# Patient Record
Sex: Female | Born: 1975
Health system: Southern US, Community
[De-identification: ages and names within clinical notes are randomized; demographics above are authoritative.]

## PROBLEM LIST (undated history)

## (undated) DIAGNOSIS — E162 Hypoglycemia, unspecified: Secondary | ICD-10-CM

---

## 2017-09-15 ENCOUNTER — Encounter: Payer: Self-pay | Admitting: Gynecology

## 2017-09-15 ENCOUNTER — Other Ambulatory Visit: Payer: Self-pay

## 2017-09-15 ENCOUNTER — Ambulatory Visit
Admission: EM | Admit: 2017-09-15 | Discharge: 2017-09-15 | Disposition: A | Discharge status: Home / Self Care | Payer: Self-pay | Attending: Emergency Medicine | Admitting: Emergency Medicine

## 2017-09-15 DIAGNOSIS — R059 Cough, unspecified: Secondary | ICD-10-CM

## 2017-09-15 DIAGNOSIS — R062 Wheezing: Secondary | ICD-10-CM

## 2017-09-15 DIAGNOSIS — R05 Cough: Secondary | ICD-10-CM

## 2017-09-15 MED ORDER — BENZONATATE 200 MG PO CAPS: ORAL_CAPSULE | ORAL | 0 refills | Status: DC

## 2017-09-15 MED ORDER — HYDROCOD POLST-CPM POLST ER 10-8 MG/5ML PO SUER: 5.0000 mL | Freq: Two times a day (BID) | ORAL | 0 refills | Status: DC

## 2017-09-15 NOTE — ED Triage Notes (Signed)
Patient c/o cough x over 1 week. Per patient cough worse last night.

## 2017-09-15 NOTE — ED Provider Notes (Signed)
[facility]    CSN: [id number] Arrival date & time: 09/15/17  1020     History   Chief Complaint Chief Complaint  Patient presents with  . Cough    HPI Ashley Silva is a 42 y.o. female.   HPI  42 year old female presents with cough that she has had for over a week.  It has been nonproductive.  She said no fever or chills.  No other associated symptoms such as nasal congestion sinus pain,ear pain etc.  Afebrile today O2 sats are 100% on room air blood pressure 146/94 pulse rate of 87 respirations of 16.  Works at [employer] in [employer] so does not have a lot of contact with the public.  Not know of any sick contacts.  He quit smoking many years ago.  No History of asthma or lung disorders in the past.  She has noticed wheezing at nighttime.  She states that she had to sleep in a recliner last night because of the severity of her coughing and this did help        History reviewed. No pertinent past medical history.  There are no active problems to display for this patient.   History reviewed. No pertinent surgical history.  OB History   None      Home Medications    Prior to Admission medications   Medication Sig Start Date End Date Taking? Authorizing Provider  benzonatate (TESSALON) 200 MG capsule Take one cap TID PRN cough 09/15/17   Lorin Picket, PA-C  chlorpheniramine-HYDROcodone [facility]) 10-8 MG/5ML SUER Take 5 mLs by mouth 2 (two) times daily. 09/15/17   Lorin Picket, PA-C    Family History Family History  Problem Relation Age of Onset  . Hypertension Mother     Social History Social History   Tobacco Use  . Smoking status: Former Research scientist (life sciences)  . Smokeless tobacco: Never Used  Substance Use Topics  . Alcohol use: Never    Frequency: Never  . Drug use: Never     Allergies   Patient has no known allergies.   Review of Systems Review of Systems  Constitutional: Negative for appetite change, chills,  fatigue and fever.  HENT: Negative for congestion.   Respiratory: Positive for cough and wheezing.   All other systems reviewed and are negative.    Physical Exam Triage Vital Signs ED Triage Vitals [09/15/17 1034]  Enc Vitals Group     BP (!) 146/94     Pulse Rate 87     Resp 16     Temp 98.6 F (37 C)     Temp Source Oral     SpO2 100 %     Weight 195 lb (88.5 kg)     Height 5\' 7"  (1.702 m)     Head Circumference      Peak Flow      Pain Score 2     Pain Loc      Pain Edu?      Excl. in [place]?    No data found.  Updated Vital Signs BP (!) 146/94 (BP Location: Right Arm)   Pulse 87   Temp 98.6 F (37 C) (Oral)   Resp 16   Ht 5\' 7"  (1.702 m)   Wt 195 lb (88.5 kg)   LMP 08/29/2017   SpO2 100%   BMI 30.54 kg/m   Visual Acuity Right Eye Distance:   Left Eye Distance:   Bilateral Distance:  Right Eye Near:   Left Eye Near:    Bilateral Near:     Physical Exam  Constitutional: She is oriented to person, place, and time. She appears well-developed and well-nourished. No distress.  HENT:  Head: Normocephalic.  Right Ear: External ear normal.  Left Ear: External ear normal.  Nose: Nose normal.  Mouth/Throat: Oropharynx is clear and moist. No oropharyngeal exudate.  Eyes: Pupils are equal, round, and reactive to light. Right eye exhibits no discharge. Left eye exhibits no discharge.  Neck: Normal range of motion.  Pulmonary/Chest: Effort normal and breath sounds normal.  Musculoskeletal: Normal range of motion.  Lymphadenopathy:    She has no cervical adenopathy.  Neurological: She is alert and oriented to person, place, and time.  Skin: Skin is warm and dry. She is not diaphoretic.  Psychiatric: She has a normal mood and affect. Her behavior is normal. Judgment and thought content normal.  Nursing note and vitals reviewed.    UC Treatments / Results  Labs (all labs ordered are listed, but only abnormal results are displayed) Labs Reviewed - No data to  display  EKG None  Radiology No results found.  Procedures Procedures (including critical care time)  Medications Ordered in UC Medications - No data to display  Initial Impression / Assessment and Plan / UC Course  I have reviewed the triage vital signs and the nursing notes.  Pertinent labs & imaging results that were available during my care of the patient were reviewed by me and considered in my medical decision making (see chart for details).     Plan: 1. Test/x-ray results and diagnosis reviewed with patient 2. rx as per orders; risks, benefits, potential side effects reviewed with patient 3. Recommend supportive treatment with rest as necessary and increase fluid intake.  Continue to sleep in recliner if necessary to prevent the proxisms of coughing.  If she runs high fevers has productive cough or persist for 2 weeks she may return to our clinic for reevaluation. 4. F/u prn if symptoms worsen or don't improve  Final Clinical Impressions(s) / UC Diagnoses   Final diagnoses:  Cough   Discharge Instructions   None    ED Prescriptions    Medication Sig Dispense Auth. Provider   chlorpheniramine-HYDROcodone (TUSSIONEX PENNKINETIC ER) 10-8 MG/5ML SUER Take 5 mLs by mouth 2 (two) times daily. 115 mL Crecencio Mc P, PA-C   benzonatate (TESSALON) 200 MG capsule Take one cap TID PRN cough 30 capsule Lorin Picket, PA-C     Controlled Substance Prescriptions [place] Controlled Substance Registry consulted? Not Applicable   Lorin Picket, PA-C 09/15/17 1059

## 2017-10-26 ENCOUNTER — Emergency Department
Admission: EM | Admit: 2017-10-26 | Discharge: 2017-10-26 | Disposition: A | Discharge status: Home / Self Care | Payer: Self-pay | Attending: Emergency Medicine | Admitting: Emergency Medicine

## 2017-10-26 ENCOUNTER — Encounter: Payer: Self-pay | Admitting: Emergency Medicine

## 2017-10-26 ENCOUNTER — Emergency Department: Payer: Self-pay

## 2017-10-26 ENCOUNTER — Other Ambulatory Visit: Payer: Self-pay

## 2017-10-26 DIAGNOSIS — R058 Other specified cough: Secondary | ICD-10-CM

## 2017-10-26 DIAGNOSIS — Z87891 Personal history of nicotine dependence: Secondary | ICD-10-CM | POA: Insufficient documentation

## 2017-10-26 DIAGNOSIS — Z3202 Encounter for pregnancy test, result negative: Secondary | ICD-10-CM | POA: Insufficient documentation

## 2017-10-26 DIAGNOSIS — R05 Cough: Secondary | ICD-10-CM | POA: Insufficient documentation

## 2017-10-26 DIAGNOSIS — M940 Chondrocostal junction syndrome [Tietze]: Secondary | ICD-10-CM | POA: Insufficient documentation

## 2017-10-26 HISTORY — DX: Hypoglycemia, unspecified: E16.2

## 2017-10-26 LAB — CBC
HCT: 28.6 % — ABNORMAL LOW (ref 35.0–47.0)
Hemoglobin: 9.1 g/dL — ABNORMAL LOW (ref 12.0–16.0)
MCH: 19.8 pg — AB (ref 26.0–34.0)
MCHC: 31.8 g/dL — AB (ref 32.0–36.0)
MCV: 62.1 fL — AB (ref 80.0–100.0)
Platelets: 308 10*3/uL (ref 150–440)
RBC: 4.61 MIL/uL (ref 3.80–5.20)
RDW: 20.6 % — AB (ref 11.5–14.5)
WBC: 6.3 10*3/uL (ref 3.6–11.0)

## 2017-10-26 LAB — BASIC METABOLIC PANEL
Anion gap: 10 (ref 5–15)
BUN: 13 mg/dL (ref 6–20)
CALCIUM: 9.3 mg/dL (ref 8.9–10.3)
CHLORIDE: 105 mmol/L (ref 98–111)
CO2: 24 mmol/L (ref 22–32)
CREATININE: 0.65 mg/dL (ref 0.44–1.00)
GFR calc non Af Amer: >60 mL/min (ref >60)
GLUCOSE: 95 mg/dL (ref 70–99)
Potassium: 3.8 mmol/L (ref 3.5–5.1)
Sodium: 139 mmol/L (ref 135–145)

## 2017-10-26 LAB — TROPONIN I
Troponin I: <0.03 ng/mL (ref <0.03)
Troponin I: <0.03 ng/mL (ref <0.03)

## 2017-10-26 LAB — POCT PREGNANCY, URINE: Preg Test, Ur: NEGATIVE

## 2017-10-26 MED ORDER — KETOROLAC TROMETHAMINE 10 MG PO TABS: 10.0000 mg | ORAL_TABLET | Freq: Three times a day (TID) | ORAL | 0 refills | Status: DC | PRN

## 2017-10-26 MED ORDER — KETOROLAC TROMETHAMINE 10 MG PO TABS
10.0000 mg | ORAL_TABLET | Freq: Once | ORAL | Status: AC
  Administered 2017-10-26: 10 mg via ORAL
  Filled 2017-10-26 (×2): qty 1

## 2017-10-26 MED ORDER — ALBUTEROL SULFATE HFA 108 (90 BASE) MCG/ACT IN AERS: 2.0000 | INHALATION_SPRAY | Freq: Four times a day (QID) | RESPIRATORY_TRACT | 0 refills | Status: DC | PRN

## 2017-10-26 MED ORDER — CETIRIZINE HCL 10 MG PO TABS: 10.0000 mg | ORAL_TABLET | Freq: Every day | ORAL | 0 refills | Status: DC

## 2017-10-26 NOTE — ED Provider Notes (Signed)
[facility] Emergency Department Provider Note  ____________________________________________  Time seen: Approximately 3:11 PM  I have reviewed the triage vital signs and the nursing notes.   HISTORY  Chief Complaint Chest Pain    HPI Ashley Silva is a 42 y.o. female my former smoker, nonpregnant, presenting with cough and chest pain.  The patient reports that for the past month, she has been having a daily nonproductive cough.  2 weeks ago, she was seen in urgent care and treated for her cough with Tussionex and Tessalon Perles, which did not help.  Her cough is worse at night or when she lays down.  The for the past 4 to 5 days, the patient has developed a central chest pain that she also has under the left breast and on the side of the chest on the left side that is worse when she pushes on it.  She has tried Advil and Aleve without improvement.  The patient denies any congestion or rhinorrhea, ear pain, sore throat, fever chills, nausea vomiting or diarrhea.  Past Medical History:  Diagnosis Date  . Hypoglycemia     There are no active problems to display for this patient.   Past Surgical History:  Procedure Laterality Date  . CESAREAN SECTION      Current Outpatient Rx  . Order #: [id number] Class: Print  . Order #: [id number] Class: Normal  . Order #: [id number] Class: Print    Allergies Patient has no known allergies.  Family History  Problem Relation Age of Onset  . Hypertension Mother     Social History Social History   Tobacco Use  . Smoking status: Former Research scientist (life sciences)  . Smokeless tobacco: Never Used  Substance Use Topics  . Alcohol use: Never    Frequency: Never  . Drug use: Never    Review of Systems Constitutional: No fever/chills.  No lightheadedness or syncope. Eyes: No visual changes. ENT: No sore throat. No congestion or rhinorrhea.  No ear pain. Cardiovascular: Positive chest pain. Denies palpitations. Respiratory: Denies  shortness of breath.  Positive nonproductive cough. Gastrointestinal: No abdominal pain.  No nausea, no vomiting.  No diarrhea.  No constipation. Genitourinary: Negative for dysuria. Musculoskeletal: Negative for back pain.  No lower extremity swelling or calf pain. Skin: Negative for rash. Neurological: Negative for headaches. No focal numbness, tingling or weakness.     ____________________________________________   PHYSICAL EXAM:  VITAL SIGNS: ED Triage Vitals  Enc Vitals Group     BP 10/26/17 1154 (!) 172/85     Pulse Rate 10/26/17 1154 95     Resp 10/26/17 1154 18     Temp 10/26/17 1154 98.8 F (37.1 C)     Temp Source 10/26/17 1154 Oral     SpO2 10/26/17 1154 100 %     Weight 10/26/17 1154 200 lb (90.7 kg)     Height 10/26/17 1154 5\' 7"  (1.702 m)     Head Circumference --      Peak Flow --      Pain Score 10/26/17 1153 8     Pain Loc --      Pain Edu? --      Excl. in [place]? --     Constitutional: Alert and oriented.  Answers questions appropriately. Eyes: Conjunctivae are normal.  EOMI. No scleral icterus. Head: Atraumatic. Nose: No congestion/rhinnorhea. Mouth/Throat: Mucous membranes are moist.  No posterior pharyngeal erythema, tonsillar swelling or exudate.  The posterior pharynx is symmetric and the uvula is midline. Neck: No stridor.  Supple.  No meningismus. Cardiovascular: Normal rate, regular rhythm. No murmurs, rubs or gallops.  Chest: There are no skin changes over the chest.  The patient does have reproducible pain when I palpate over the sternum and under the left breast and lateral chest wall, or with positional changes. Respiratory: Normal respiratory effort.  No accessory muscle use or retractions. Lungs CTAB.  No wheezes, rales or ronchi. Gastrointestinal: Overweight.  Soft, nontender and nondistended.  No guarding or rebound.  No peritoneal signs. Musculoskeletal: No LE edema. No ttp in the calves or palpable cords.  Negative Homan's  sign. Neurologic:  A&Ox3.  Speech is clear.  Face and smile are symmetric.  EOMI.  Moves all extremities well. Skin:  Skin is warm, dry and intact. No rash noted. Psychiatric: Mood and affect are normal. Speech and behavior are normal.  Normal judgement  ____________________________________________   LABS (all labs ordered are listed, but only abnormal results are displayed)  Labs Reviewed  CBC - Abnormal; Notable for the following components:      Result Value   Hemoglobin 9.1 (*)    HCT 28.6 (*)    MCV 62.1 (*)    MCH 19.8 (*)    MCHC 31.8 (*)    RDW 20.6 (*)    All other components within normal limits  BASIC METABOLIC PANEL  TROPONIN I  TROPONIN I  POC URINE PREG, ED  POCT PREGNANCY, URINE   ____________________________________________  EKG  ED ECG REPORT I, Anne-Caroline Mariea Clonts, the attending physician, personally viewed and interpreted this ECG.   Date: 10/26/2017  EKG Time: 1139  Rate: 102  Rhythm: sinus tachycardia  Axis: normal  Intervals:none  ST&T Change: No STEMI  ____________________________________________  RADIOLOGY  Dg Chest 2 View  Result Date: 10/26/2017 CLINICAL DATA:  Left-sided chest pain. EXAM: CHEST - 2 VIEW COMPARISON:  No prior. FINDINGS: Mediastinum and hilar structures normal. Mild left base subsegmental atelectasis and or scarring. No focal alveolar infiltrate. No pleural effusion or pneumothorax. Heart size normal. No acute bony abnormality. IMPRESSION: Mild left base subsegmental atelectasis and or scarring. Exam otherwise unremarkable. Electronically Signed   By: Marcello Moores  Register   On: 10/26/2017 12:43    ____________________________________________   PROCEDURES  Procedure(s) performed: None  Procedures  Critical Care performed: No ____________________________________________   INITIAL IMPRESSION / ASSESSMENT AND PLAN / ED COURSE  Pertinent labs & imaging results that were available during my care of the patient were  reviewed by me and considered in my medical decision making (see chart for details).  42 y.o. female, otherwise healthy, presenting with 1 month of cough, now with left-sided chest pain.  The most likely etiology of the patient's chest pain is costochondritis or musculoskeletal strain from her coughing.  I will plan to treat her with Toradol for her symptoms, and treat her cough with albuterol to decrease strain and help her heal.  Patient's chest x-ray does not have any infiltrate in her pulmonary examination is reassuring with normal oxygen saturation; pneumonia is not suspected at this time.  She may have a bronchitis, or I would be concerned about a postnasal drip causing a coughing reflex.  We will plan to start her on Zyrtec to see if this can decrease her cough as well.  At this time, she is afebrile has no infiltrate and no elevation of her white blood cell count with a chronic cough; antibiotics are not indicated.  ACS or MI is very unlikely; the patient does not have any ischemic  changes and her troponins are negative.  In addition, aortic pathology and PE are considered but also very unlikely.  The patient will be discharged home and I have recommended that she establish a primary care physician for follow-up.  Return precautions were discussed.  ____________________________________________  FINAL CLINICAL IMPRESSION(S) / ED DIAGNOSES  Final diagnoses:  Costochondritis, acute  Nonproductive cough         NEW MEDICATIONS STARTED DURING THIS VISIT:  New Prescriptions   ALBUTEROL (PROVENTIL HFA;VENTOLIN HFA) 108 (90 BASE) MCG/ACT INHALER    Inhale 2 puffs into the lungs every 6 (six) hours as needed for wheezing or shortness of breath.   CETIRIZINE (ZYRTEC ALLERGY) 10 MG TABLET    Take 1 tablet (10 mg total) by mouth daily.   KETOROLAC (TORADOL) 10 MG TABLET    Take 1 tablet (10 mg total) by mouth every 8 (eight) hours as needed for moderate pain (with food).      Eula Listen, MD 10/26/17 [phone]

## 2017-10-26 NOTE — ED Triage Notes (Signed)
Pt presents to ED via POV with c/o CP x 1 week. Pt descibes pain as pulsating. States recently dx with URI but now pain is totally different than it was. Pt states pain radiates from L chest to L axilla.

## 2017-10-26 NOTE — Discharge Instructions (Addendum)
For your cough, take albuterol as needed for coughing spasms.  Please start to take Zyrtec every day to prevent postnasal drip, which should improve your cough.  If it does not improve your cough after 2 weeks, please discontinue this medication.  Stop taking Tussionex and Tessalon Perles as these are not helping her cough.  You may take Tylenol or Toradol for your chest pain.  Do not take other NSAID medications including Advil, Aleve, ibuprofen or Motrin when you are taking Toradol.  Please take Toradol with a small amount of food to prevent irritation of your stomach.  Return to the emergency department if you develop severe pain, lightheadedness or fainting, palpitations, shortness of breath, fever, or any other symptoms concerning to you.

## 2018-01-29 ENCOUNTER — Ambulatory Visit
Admission: EM | Admit: 2018-01-29 | Discharge: 2018-01-29 | Disposition: A | Discharge status: Home / Self Care | Payer: Managed Care, Other (non HMO) | Attending: Emergency Medicine | Admitting: Emergency Medicine

## 2018-01-29 ENCOUNTER — Other Ambulatory Visit: Payer: Self-pay

## 2018-01-29 DIAGNOSIS — J111 Influenza due to unidentified influenza virus with other respiratory manifestations: Secondary | ICD-10-CM

## 2018-01-29 DIAGNOSIS — R0981 Nasal congestion: Secondary | ICD-10-CM

## 2018-01-29 DIAGNOSIS — R05 Cough: Secondary | ICD-10-CM

## 2018-01-29 DIAGNOSIS — R69 Illness, unspecified: Secondary | ICD-10-CM | POA: Insufficient documentation

## 2018-01-29 LAB — RAPID INFLUENZA A&B ANTIGENS
Influenza A (ARMC): NEGATIVE
Influenza B (ARMC): NEGATIVE

## 2018-01-29 MED ORDER — OSELTAMIVIR PHOSPHATE 75 MG PO CAPS: 75.0000 mg | ORAL_CAPSULE | Freq: Two times a day (BID) | ORAL | 0 refills | Status: DC

## 2018-01-29 NOTE — ED Provider Notes (Signed)
[facility] ____________________________________________  Time seen: Approximately 12:35 PM  I have reviewed the triage vital signs and the nursing notes.   HISTORY  Chief Complaint Fever (APPT)   HPI Ashley Silva is a 42 y.o. female presenting for evaluation of some cough, minimal nasal congestion, chills, body aches and fever that started quickly last night.  Reports her husband had symptoms just prior to her and was positive for influenza when he was seen in urgent care last night.  States did take some over-the-counter cough and congestion medication as well as Tylenol and did take prior to arrival.  Continues to drink fluids well, decreased appetite.  States overall feels tired and has body aches.  Reports otherwise doing well.  Denies chest pain or shortness of breath.  No recent sickness.   Past Medical History:  Diagnosis Date  . Hypoglycemia     There are no active problems to display for this patient.   Past Surgical History:  Procedure Laterality Date  . CESAREAN SECTION       No current facility-administered medications for this encounter.   Current Outpatient Medications:  .  albuterol (PROVENTIL HFA;VENTOLIN HFA) 108 (90 Base) MCG/ACT inhaler, Inhale 2 puffs into the lungs every 6 (six) hours as needed for wheezing or shortness of breath., Disp: 1 Inhaler, Rfl: 0 .  oseltamivir (TAMIFLU) 75 MG capsule, Take 1 capsule (75 mg total) by mouth every 12 (twelve) hours., Disp: 10 capsule, Rfl: 0  Allergies Patient has no known allergies.  Family History  Problem Relation Age of Onset  . Hypertension Mother     Social History Social History   Tobacco Use  . Smoking status: Former Research scientist (life sciences)  . Smokeless tobacco: Never Used  Substance Use Topics  . Alcohol use: Never    Frequency: Never  . Drug use: Never    Review of Systems Constitutional: Positive fever ENT: as above.  Cardiovascular: Denies chest pain. Respiratory: Denies shortness of  breath. Gastrointestinal: No abdominal pain.  Musculoskeletal: Negative for back pain. Skin: Negative for rash.   ____________________________________________   PHYSICAL EXAM:  VITAL SIGNS: ED Triage Vitals  Enc Vitals Group     BP 01/29/18 1119 (!) 159/93     Pulse Rate 01/29/18 1119 (!) 102     Resp 01/29/18 1119 18     Temp 01/29/18 1119 99.9 F (37.7 C)     Temp Source 01/29/18 1119 Oral     SpO2 01/29/18 1119 100 %     Weight 01/29/18 1116 200 lb (90.7 kg)     Height 01/29/18 1116 5\' 7"  (1.702 m)     Head Circumference --      Peak Flow --      Pain Score 01/29/18 1116 5     Pain Loc --      Pain Edu? --      Excl. in [place]? --    Constitutional: Alert and oriented. Well appearing and in no acute distress. Eyes: Conjunctivae are normal.  Head: Atraumatic. No sinus tenderness to palpation. No swelling. No erythema.  Ears: no erythema, normal TMs bilaterally.   Nose:Nasal congestion  Mouth/Throat: Mucous membranes are moist. No pharyngeal erythema. No tonsillar swelling or exudate.  Neck: No stridor.  No cervical spine tenderness to palpation. Hematological/Lymphatic/Immunilogical: No cervical lymphadenopathy. Cardiovascular: Normal rate, regular rhythm. Grossly normal heart sounds.  Good peripheral circulation. Respiratory: Normal respiratory effort.  No retractions. No wheezes, rales or rhonchi. Good air movement.  Musculoskeletal: Ambulatory with steady gait.  Neurologic:  Normal speech and language. No gait instability. Skin:  Skin appears warm, dry and intact. No rash noted. Psychiatric: Mood and affect are normal. Speech and behavior are normal. ___________________________________________   LABS (all labs ordered are listed, but only abnormal results are displayed)  Labs Reviewed  RAPID INFLUENZA A&B ANTIGENS ([place])     PROCEDURES Procedures    INITIAL IMPRESSION / ASSESSMENT AND PLAN / ED COURSE  Pertinent labs & imaging results that were  available during my care of the patient were reviewed by me and considered in my medical decision making (see chart for details).  Well-appearing patient.  No acute distress.  Suspect influenza.  Influenza test negative, suspect false negative.  Will treat with Tamiflu.  Encourage rest, fluids, supportive care and over-the-counter medication use.Discussed indication, risks and benefits of medications with patient.  Discussed follow up with Primary care physician this week as needed.  Discussed follow up and return parameters including no resolution or any worsening concerns. Patient verbalized understanding and agreed to plan.   ____________________________________________   FINAL CLINICAL IMPRESSION(S) / ED DIAGNOSES  Final diagnoses:  Influenza-like illness     ED Discharge Orders         Ordered    oseltamivir (TAMIFLU) 75 MG capsule  Every 12 hours     01/29/18 1206           Note: This dictation was prepared with Dragon dictation along with smaller phrase technology. Any transcriptional errors that result from this process are unintentional.         Marylene Land, NP 01/29/18 1458

## 2018-01-29 NOTE — Discharge Instructions (Signed)
Take medication as prescribed. Rest. Drink plenty of fluids. Over the counter as discussed.   Follow up with your primary care physician this week as needed. Return to Urgent care for new or worsening concerns.

## 2018-01-29 NOTE — ED Triage Notes (Signed)
Patient complains of body aches and fever with cough. Patient states that her husband was positive for the flu last night.

## 2019-01-07 ENCOUNTER — Ambulatory Visit
Admission: EM | Admit: 2019-01-07 | Discharge: 2019-01-07 | Disposition: A | Discharge status: Home / Self Care | Payer: BC Managed Care – PPO | Attending: Family Medicine | Admitting: Family Medicine

## 2019-01-07 ENCOUNTER — Ambulatory Visit (INDEPENDENT_AMBULATORY_CARE_PROVIDER_SITE_OTHER): Payer: BC Managed Care – PPO

## 2019-01-07 ENCOUNTER — Encounter: Payer: Self-pay | Admitting: Emergency Medicine

## 2019-01-07 ENCOUNTER — Other Ambulatory Visit: Payer: Self-pay

## 2019-01-07 DIAGNOSIS — M545 Low back pain, unspecified: Secondary | ICD-10-CM

## 2019-01-07 MED ORDER — KETOROLAC TROMETHAMINE 60 MG/2ML IM SOLN
60.0000 mg | Freq: Once | INTRAMUSCULAR | Status: AC
  Administered 2019-01-07: 60 mg via INTRAMUSCULAR

## 2019-01-07 MED ORDER — TIZANIDINE HCL 4 MG PO TABS: 4.0000 mg | ORAL_TABLET | Freq: Three times a day (TID) | ORAL | 0 refills | Status: DC | PRN

## 2019-01-07 MED ORDER — KETOROLAC TROMETHAMINE 10 MG PO TABS: 10.0000 mg | ORAL_TABLET | Freq: Four times a day (QID) | ORAL | 0 refills | Status: DC | PRN

## 2019-01-07 NOTE — Discharge Instructions (Signed)
Rest.  Heat.  Medications as directed.  Take care  Dr. Laurel Harnden  

## 2019-01-07 NOTE — ED Provider Notes (Signed)
[facility]    CSN: [id number] Arrival date & time: 01/07/19  1649  History   Chief Complaint Chief Complaint  Patient presents with  . Back Pain   HPI  43 year old female presents with low back pain.  Patient reports ongoing low back pain which started on 11/27.  Denies fall, trauma, injury.  Patient reports that is located in the midline of the low back.  She rates her pain is 8/10 in severity.  Has been progressively worsening.  She has taken ibuprofen without resolution.  Worse with activity.  No radicular symptoms.  No saddle anesthesia.  No other medications or interventions tried.  No other associated symptoms.  No other complaints.  PMH, Surgical Hx, Family Hx, Social History reviewed and updated as below.  Past Medical History:  Diagnosis Date  . Hypoglycemia    Past Surgical History:  Procedure Laterality Date  . CESAREAN SECTION     OB History   No obstetric history on file.    Home Medications    Prior to Admission medications   Medication Sig Start Date End Date Taking? Authorizing Provider  ketorolac (TORADOL) 10 MG tablet Take 1 tablet (10 mg total) by mouth every 6 (six) hours as needed for moderate pain or severe pain. 01/07/19   Coral Spikes, DO  tiZANidine (ZANAFLEX) 4 MG tablet Take 1 tablet (4 mg total) by mouth every 8 (eight) hours as needed for muscle spasms. 01/07/19   Coral Spikes, DO  albuterol (PROVENTIL HFA;VENTOLIN HFA) 108 (90 Base) MCG/ACT inhaler Inhale 2 puffs into the lungs every 6 (six) hours as needed for wheezing or shortness of breath. 10/26/17 01/07/19  Eula Listen, MD   Family History Family History  Problem Relation Age of Onset  . Hypertension Mother    Social History Social History   Tobacco Use  . Smoking status: Former Research scientist (life sciences)  . Smokeless tobacco: Never Used  Substance Use Topics  . Alcohol use: Never    Frequency: Never  . Drug use: Never    Allergies   Patient has no known allergies.    Review of Systems Review of Systems  Musculoskeletal: Positive for back pain.  Neurological: Negative for numbness.   Physical Exam Triage Vital Signs ED Triage Vitals  Enc Vitals Group     BP 01/07/19 1732 (!) 165/93     Pulse Rate 01/07/19 1732 98     Resp 01/07/19 1732 18     Temp 01/07/19 1732 98.4 F (36.9 C)     Temp Source 01/07/19 1732 Oral     SpO2 01/07/19 1732 100 %     Weight 01/07/19 1731 205 lb (93 kg)     Height 01/07/19 1731 5\' 7"  (1.702 m)     Head Circumference --      Peak Flow --      Pain Score 01/07/19 1730 8     Pain Loc --      Pain Edu? --      Excl. in [place]? --    Updated Vital Signs BP (!) 165/93 (BP Location: Right Arm)   Pulse 98   Temp 98.4 F (36.9 C) (Oral)   Resp 18   Ht 5\' 7"  (1.702 m)   Wt 93 kg   LMP 12/24/2018 Comment: preg form signed   SpO2 100%   BMI 32.11 kg/m   Visual Acuity Right Eye Distance:   Left Eye Distance:   Bilateral Distance:    Right Eye Near:  Left Eye Near:    Bilateral Near:     Physical Exam Vitals signs and nursing note reviewed.  Constitutional:      Appearance: She is not ill-appearing.     Comments: Appears in pain.  HENT:     Head: Normocephalic and atraumatic.  Eyes:     General:        Right eye: No discharge.        Left eye: No discharge.     Conjunctiva/sclera: Conjunctivae normal.  Cardiovascular:     Rate and Rhythm: Normal rate and regular rhythm.     Heart sounds: No murmur.  Pulmonary:     Effort: Pulmonary effort is normal.     Breath sounds: Normal breath sounds. No wheezing, rhonchi or rales.  Musculoskeletal:     Comments: Lumbar spine -right paraspinal musculature tenderness to palpation.  Markedly decreased range of motion secondary to pain.  Patient difficulty getting on the table.  Neurological:     Mental Status: She is alert.  Psychiatric:        Mood and Affect: Mood normal.        Behavior: Behavior normal.    UC Treatments / Results  Labs (all labs ordered  are listed, but only abnormal results are displayed) Labs Reviewed - No data to display  EKG   Radiology Dg Lumbar Spine Complete  Result Date: 01/07/2019 CLINICAL DATA:  Acute low back pain without injury. EXAM: LUMBAR SPINE - COMPLETE 4+ VIEW COMPARISON:  None. FINDINGS: There is no evidence of lumbar spine fracture. Alignment is normal. Intervertebral disc spaces are maintained. IMPRESSION: Negative. Electronically Signed   By: Marijo Conception M.D.   On: 01/07/2019 18:28    Procedures Procedures (including critical care time)  Medications Ordered in UC Medications  ketorolac (TORADOL) injection 60 mg (60 mg Intramuscular Given 01/07/19 1747)    Initial Impression / Assessment and Plan / UC Course  I have reviewed the triage vital signs and the nursing notes.  Pertinent labs & imaging results that were available during my care of the patient were reviewed by me and considered in my medical decision making (see chart for details).    43 year old female presents with low back pain.  X-ray obtained given current severity of symptoms.  X-ray negative.  Toradol given today.  Mild improvement.  Discharging home on Toradol and Zanaflex.  Work note given.  Final Clinical Impressions(s) / UC Diagnoses   Final diagnoses:  Acute bilateral low back pain without sciatica     Discharge Instructions     Rest.  Heat.  Medications as directed.  Take care  Dr. Lacinda Axon     ED Prescriptions    Medication Sig Dispense Auth. Provider   ketorolac (TORADOL) 10 MG tablet Take 1 tablet (10 mg total) by mouth every 6 (six) hours as needed for moderate pain or severe pain. 20 tablet Keisha Amer G, DO   tiZANidine (ZANAFLEX) 4 MG tablet Take 1 tablet (4 mg total) by mouth every 8 (eight) hours as needed for muscle spasms. 30 tablet Coral Spikes, DO     PDMP not reviewed this encounter.   Coral Spikes, [place] 01/07/19 1831

## 2019-01-07 NOTE — ED Triage Notes (Signed)
Patient c/o low back pain that started the day after Thanksgiving. Denies injury.

## 2019-06-16 DIAGNOSIS — L814 Other melanin hyperpigmentation: Secondary | ICD-10-CM | POA: Diagnosis not present

## 2019-06-16 DIAGNOSIS — D235 Other benign neoplasm of skin of trunk: Secondary | ICD-10-CM | POA: Diagnosis not present

## 2019-06-16 DIAGNOSIS — D229 Melanocytic nevi, unspecified: Secondary | ICD-10-CM | POA: Diagnosis not present

## 2019-06-16 DIAGNOSIS — L821 Other seborrheic keratosis: Secondary | ICD-10-CM | POA: Diagnosis not present

## 2019-11-22 IMAGING — CR DG CHEST 2V
1 series · 2 of 2 positions shown · non-contrast
Comparison: No prior.

CLINICAL DATA: Left-sided chest pain.

EXAM:
CHEST - 2 VIEW

[Series 1: dg chest 2 view · 0.14mm/px · 2 of 2 slices shown]
[im 1/2]
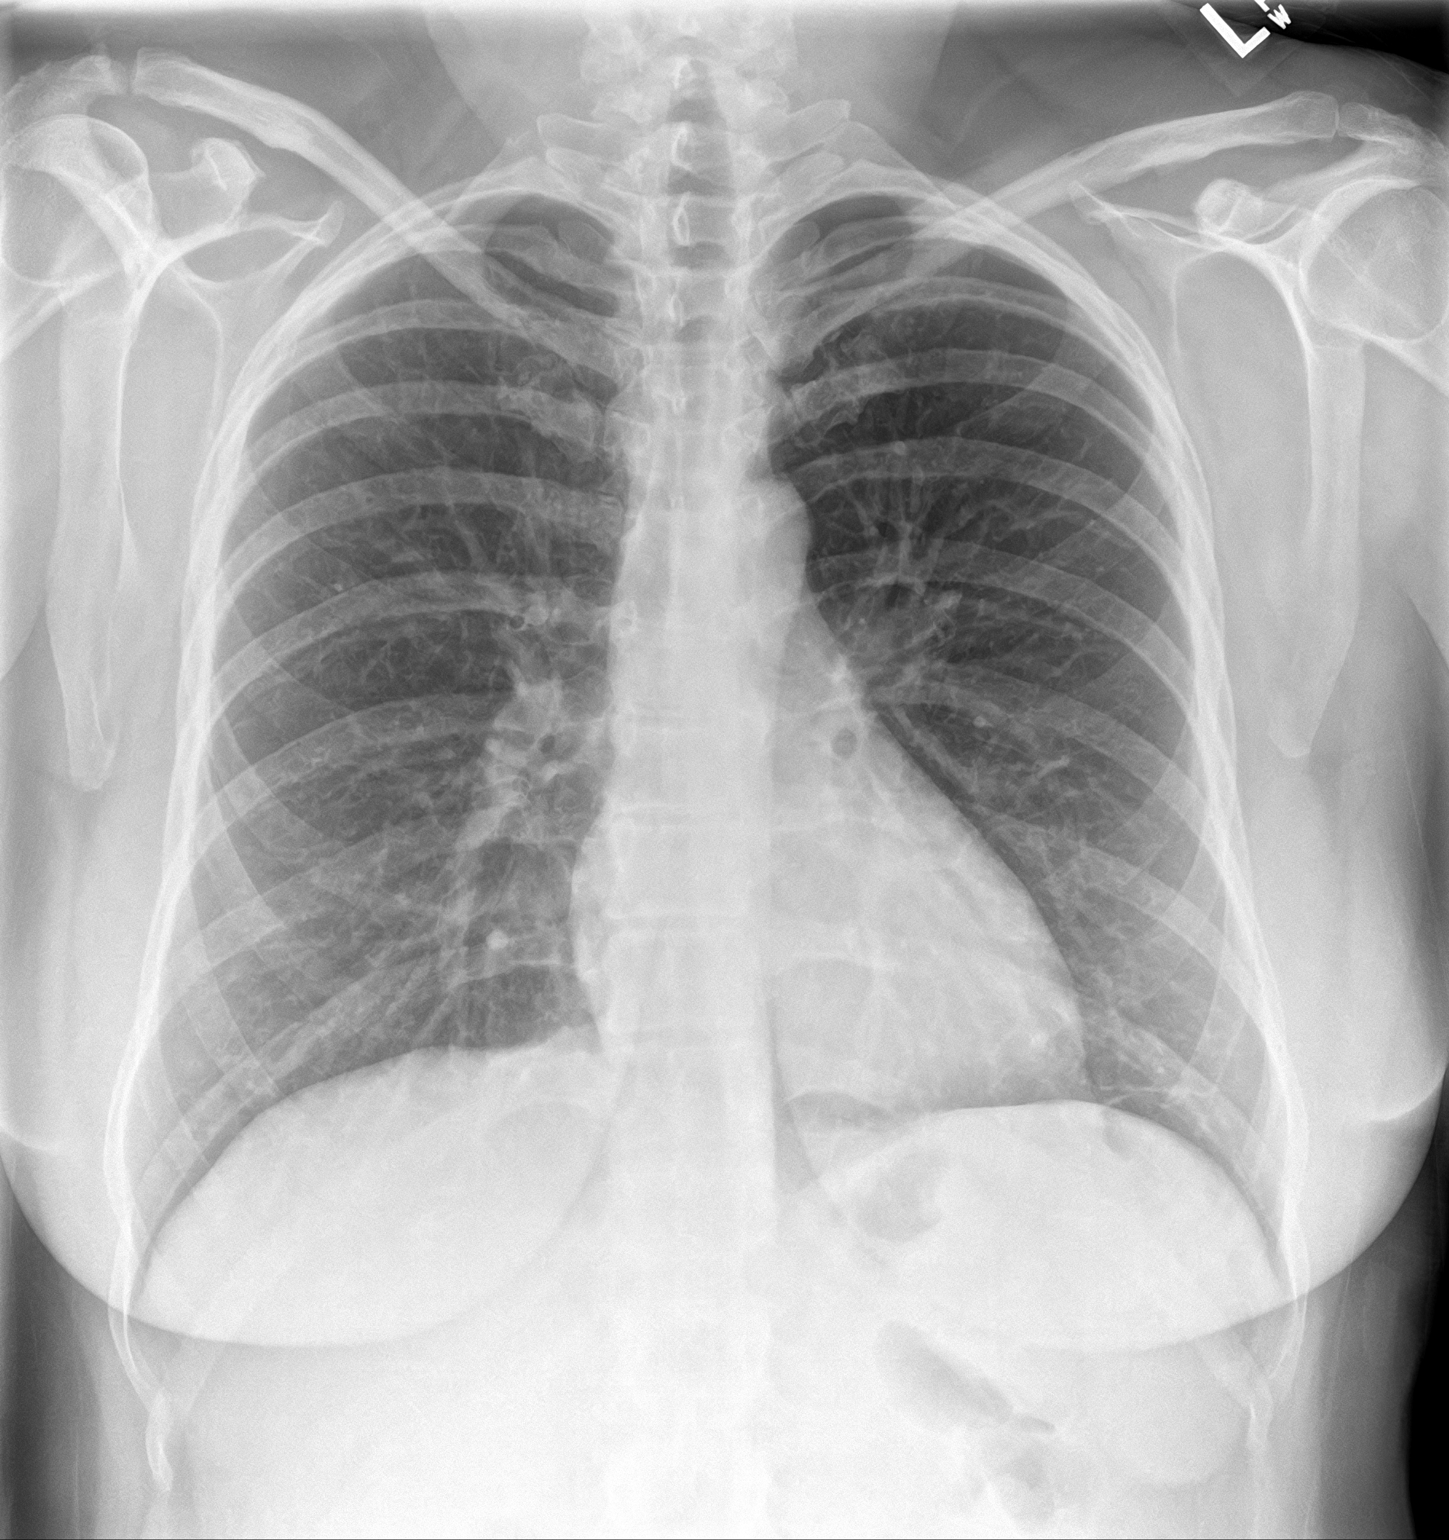
[im 2/2]
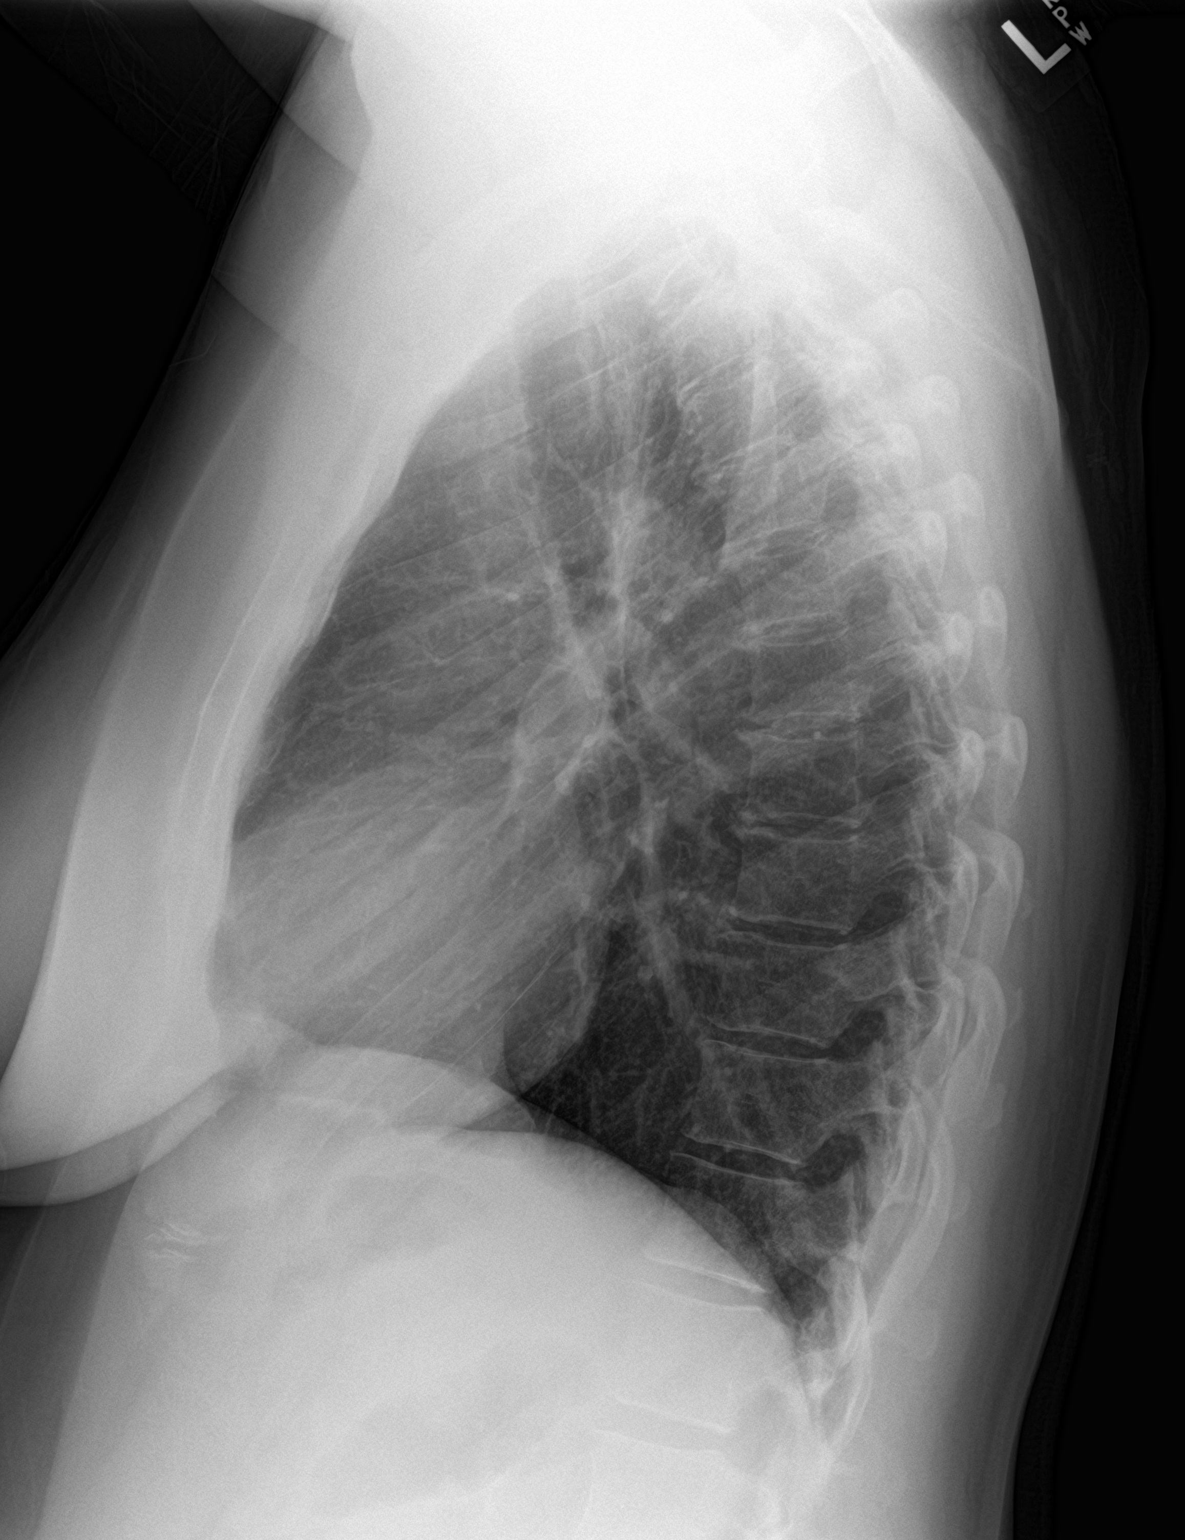

[2 of 2 positions shown; findings below may reference images not displayed]

FINDINGS: Mediastinum and hilar structures normal. Mild left base subsegmental
atelectasis and or scarring. No focal alveolar infiltrate. No
pleural effusion or pneumothorax. Heart size normal. No acute bony
abnormality.
IMPRESSION: Mild left base subsegmental atelectasis and or scarring. Exam
otherwise unremarkable.

## 2020-01-23 ENCOUNTER — Encounter: Payer: Self-pay | Admitting: Emergency Medicine

## 2020-01-23 ENCOUNTER — Other Ambulatory Visit: Payer: Self-pay

## 2020-01-23 ENCOUNTER — Ambulatory Visit
Admission: EM | Admit: 2020-01-23 | Discharge: 2020-01-23 | Disposition: A | Discharge status: Home / Self Care | Payer: BC Managed Care – PPO | Attending: Physician Assistant | Admitting: Physician Assistant

## 2020-01-23 DIAGNOSIS — U071 COVID-19: Secondary | ICD-10-CM | POA: Diagnosis not present

## 2020-01-23 DIAGNOSIS — R509 Fever, unspecified: Secondary | ICD-10-CM

## 2020-01-23 DIAGNOSIS — R059 Cough, unspecified: Secondary | ICD-10-CM | POA: Diagnosis not present

## 2020-01-23 LAB — RESP PANEL BY RT-PCR (FLU A&B, COVID) ARPGX2
Influenza A by PCR: NEGATIVE
Influenza B by PCR: NEGATIVE
SARS Coronavirus 2 by RT PCR: POSITIVE — AB

## 2020-01-23 NOTE — Discharge Instructions (Signed)

## 2020-01-23 NOTE — ED Provider Notes (Signed)
[facility]    CSN: [id number] Arrival date & time: 01/23/20  1210      History   Chief Complaint Chief Complaint  Patient presents with   Cough   Fever   Covid Positive    HPI Ashley Silva is a 44 y.o. female presenting for onset of cough, runny nose, headache and fever up to 100.9 degrees over the past day.  Patient denies any known COVID-19 exposure and has not been vaccinated for COVID-19.  She did test positive with an at home Covid test this morning.  Patient has been taking Tylenol Cold and flu with the last dose about 3 hours ago.  She says this medication does improve her symptoms.  Patient denies any associated weakness, body aches, sinus pain, ear pain, chest discomfort, shortness of breath or wheezing, abdominal pain, nausea/vomiting/diarrhea, or changes in smell or taste.  Past medical history only significant for hypoglycemia.  Denies any history of cardiopulmonary disease.  Patient has no other complaints or concerns.  HPI  Past Medical History:  Diagnosis Date   Hypoglycemia     There are no problems to display for this patient.   Past Surgical History:  Procedure Laterality Date   CESAREAN SECTION      OB History   No obstetric history on file.      Home Medications    Prior to Admission medications   Medication Sig Start Date End Date Taking? Authorizing Provider  albuterol (PROVENTIL HFA;VENTOLIN HFA) 108 (90 Base) MCG/ACT inhaler Inhale 2 puffs into the lungs every 6 (six) hours as needed for wheezing or shortness of breath. 10/26/17 01/07/19  Eula Listen, MD    Family History Family History  Problem Relation Age of Onset   Hypertension Mother    Other Father        unknown medical history    Social History Social History   Tobacco Use   Smoking status: Former Smoker    Quit date: 01/23/1996    Years since quitting: 24.0   Smokeless tobacco: Never Used  Vaping Use   Vaping Use: Never used  Substance  Use Topics   Alcohol use: Never   Drug use: Never     Allergies   Patient has no known allergies.   Review of Systems Review of Systems  Constitutional: Positive for fatigue and fever. Negative for chills and diaphoresis.  HENT: Positive for congestion and rhinorrhea. Negative for ear pain, sinus pressure, sinus pain and sore throat.   Respiratory: Positive for cough. Negative for shortness of breath.   Gastrointestinal: Negative for abdominal pain, nausea and vomiting.  Musculoskeletal: Negative for arthralgias and myalgias.  Skin: Negative for rash.  Neurological: Positive for headaches. Negative for weakness.  Hematological: Negative for adenopathy.     Physical Exam Triage Vital Signs ED Triage Vitals  Enc Vitals Group     BP 01/23/20 1334 (!) 152/87     Pulse Rate 01/23/20 1334 (!) 106     Resp 01/23/20 1334 18     Temp 01/23/20 1334 98.3 F (36.8 C)     Temp Source 01/23/20 1334 Oral     SpO2 01/23/20 1334 100 %     Weight 01/23/20 1336 185 lb (83.9 kg)     Height 01/23/20 1336 5\' 7"  (1.702 m)     Head Circumference --      Peak Flow --      Pain Score 01/23/20 1335 7     Pain Loc --  Pain Edu? --      Excl. in [place]? --    No data found.  Updated Vital Signs BP (!) 152/87 (BP Location: Left Arm)    Pulse (!) 106    Temp 98.3 F (36.8 C) (Oral)    Resp 18    Ht 5\' 7"  (1.702 m)    Wt 185 lb (83.9 kg)    LMP 01/16/2020 (Approximate)    SpO2 100%    BMI 28.98 kg/m       Physical Exam Vitals and nursing note reviewed.  Constitutional:      General: She is not in acute distress.    Appearance: Normal appearance. She is not ill-appearing or toxic-appearing.  HENT:     Head: Normocephalic and atraumatic.     Nose: Congestion and rhinorrhea present.     Mouth/Throat:     Mouth: Mucous membranes are moist.     Pharynx: Oropharynx is clear. Posterior oropharyngeal erythema present.  Eyes:     General: No scleral icterus.       Right eye: No discharge.         Left eye: No discharge.     Conjunctiva/sclera: Conjunctivae normal.  Cardiovascular:     Rate and Rhythm: Regular rhythm. Tachycardia present.     Heart sounds: Normal heart sounds.  Pulmonary:     Effort: Pulmonary effort is normal. No respiratory distress.     Breath sounds: Normal breath sounds.  Musculoskeletal:     Cervical back: Neck supple.  Skin:    General: Skin is dry.  Neurological:     General: No focal deficit present.     Mental Status: She is alert. Mental status is at baseline.     Motor: No weakness.     Gait: Gait normal.  Psychiatric:        Mood and Affect: Mood normal.        Behavior: Behavior normal.        Thought Content: Thought content normal.      UC Treatments / Results  Labs (all labs ordered are listed, but only abnormal results are displayed) Labs Reviewed  RESP PANEL BY RT-PCR (FLU A&B, COVID) ARPGX2 - Abnormal; Notable for the following components:      Result Value   SARS Coronavirus 2 by RT PCR POSITIVE (*)    All other components within normal limits    EKG   Radiology No results found.  Procedures Procedures (including critical care time)  Medications Ordered in UC Medications - No data to display  Initial Impression / Assessment and Plan / UC Course  I have reviewed the triage vital signs and the nursing notes.  Pertinent labs & imaging results that were available during my care of the patient were reviewed by me and considered in my medical decision making (see chart for details).   44 y/o female presenting for cough, fever, congestion since yesterday.  Patient had positive at home Covid test.  BP elevated 152/87.  Patient afebrile.  Pulse elevated at 106.  Oxygen saturation 100%.  Patient is well-appearing and in no acute distress.  Chest is clear to auscultation heart regular rate and rhythm.  Chest minor nasal congestion/rhinorrhea on exam.  Respiratory panel obtained with positive COVID-19 test result.  Advised  patient of result.  See guidelines, isolation protocol and ED precautions reviewed with patient.  Advised I could send a cough medication for her, but she said that the over-the-counter medication she is taking are  working just fine and she will continue them.  Work note given for her 10-day isolation period.  Again, ED precautions thoroughly reviewed with patient.   Final Clinical Impressions(s) / UC Diagnoses   Final diagnoses:  Cough  Fever, unspecified  COVID-19     Discharge Instructions     You have received COVID testing today either for positive exposure, concerning symptoms that could be related to COVID infection, screening purposes, or re-testing after confirmed positive.  Your test obtained today checks for active viral infection in the last 1-2 weeks. If your test is negative now, you can still test positive later. So, if you do develop symptoms you should either get re-tested and/or isolate x 10 days. Please follow CDC guidelines.  While Rapid antigen tests come back in 15-20 minutes, send out PCR/molecular test results typically come back within 24 hours. In the mean time, if you are symptomatic, assume this could be a positive test and treat/monitor yourself as if you do have COVID.   We will call with test results. Please download the MyChart app and set up a profile to access test results.   If symptomatic, go home and rest. Push fluids. Take Tylenol as needed for discomfort. Gargle warm salt water. Throat lozenges. Take Mucinex DM or Robitussin for cough. Humidifier in bedroom to ease coughing. Warm showers. Also review the COVID handout for more information.  COVID-19 INFECTION: The incubation period of COVID-19 is approximately 14 days after exposure, with most symptoms developing in roughly 4-5 days. Symptoms may range in severity from mild to critically severe. Roughly 80% of those infected will have mild symptoms. People of any age may become infected with COVID-19  and have the ability to transmit the virus. The most common symptoms include: fever, fatigue, cough, body aches, headaches, sore throat, nasal congestion, shortness of breath, nausea, vomiting, diarrhea, changes in smell and/or taste.    COURSE OF ILLNESS Some patients may begin with mild disease which can progress quickly into critical symptoms. If your symptoms are worsening please call ahead to the Emergency Department and proceed there for further treatment. Recovery time appears to be roughly 1-2 weeks for mild symptoms and 3-6 weeks for severe disease.   GO IMMEDIATELY TO ER FOR FEVER YOU ARE UNABLE TO GET DOWN WITH TYLENOL, BREATHING PROBLEMS, CHEST PAIN, FATIGUE, LETHARGY, INABILITY TO EAT OR DRINK, ETC  QUARANTINE AND ISOLATION: To help decrease the spread of COVID-19 please remain isolated if you have COVID infection or are highly suspected to have COVID infection. This means -stay home and isolate to one room in the home if you live with others. Do not share a bed or bathroom with others while ill, sanitize and wipe down all countertops and keep common areas clean and disinfected. You may discontinue isolation if you have a mild case and are asymptomatic 10 days after symptom onset as long as you have been fever free >24 hours without having to take Motrin or Tylenol. If your case is more severe (meaning you develop pneumonia or are admitted in the hospital), you may have to isolate longer.   If you have been in close contact (within 6 feet) of someone diagnosed with COVID 19, you are advised to quarantine in your home for 14 days as symptoms can develop anywhere from 2-14 days after exposure to the virus. If you develop symptoms, you  must isolate.  Most current guidelines for COVID after exposure -isolate 10 days if you ARE NOT tested for COVID  as long as symptoms do not develop -isolate 7 days if you are tested and remain asymptomatic -You do not necessarily need to be tested for COVID if  you have + exposure and        develop   symptoms. Just isolate at home x10 days from symptom onset During this global pandemic, CDC advises to practice social distancing, try to stay at least 80ft away from others at all times. Wear a face covering. Wash and sanitize your hands regularly and avoid going anywhere that is not necessary.  KEEP IN MIND THAT THE COVID TEST IS NOT 100% ACCURATE AND YOU SHOULD STILL DO EVERYTHING TO PREVENT POTENTIAL SPREAD OF VIRUS TO OTHERS (WEAR MASK, WEAR GLOVES, WASH HANDS AND SANITIZE REGULARLY). IF INITIAL TEST IS NEGATIVE, THIS MAY NOT MEAN YOU ARE DEFINITELY NEGATIVE. MOST ACCURATE TESTING IS DONE 5-7 DAYS AFTER EXPOSURE.   It is not advised by CDC to get re-tested after receiving a positive COVID test since you can still test positive for weeks to months after you have already cleared the virus.   *If you have not been vaccinated for COVID, I strongly suggest you consider getting vaccinated as long as there are no contraindications.      ED Prescriptions    None     PDMP not reviewed this encounter.   Shirlee Latch, PA-C 01/23/20 1444

## 2020-01-23 NOTE — ED Triage Notes (Signed)
Patient in today c/o cough, runny nose and fever (100.9) x 1 day. Patient does states that she has had a headache x 2-3 days. Patient has not had the covid vaccines. Patient took an at home covid test this morning and it was positive. Patient has taken Tylenol cold and flu, last dose at ~10:30am today.

## 2020-01-25 ENCOUNTER — Telehealth: Payer: Self-pay | Admitting: Oncology

## 2020-01-25 NOTE — Telephone Encounter (Signed)
Re: [facility]fusion  Called to Discuss with patient about Covid symptoms and the use of regeneron, a monoclonal antibody infusion for those with mild to moderate Covid symptoms and at a high risk of hospitalization.     Patient does not meet criteria for infusion.   Past Medical History:  Diagnosis Date  . Hypoglycemia     Specific risk condition-2 Unvaccinated Home test 12/24 BMI-28.98   Unable to reach pt. Left VM.  Rulon Abide, AGNP-C [phone] ([place])

## 2020-03-19 ENCOUNTER — Other Ambulatory Visit: Payer: Self-pay

## 2020-03-19 ENCOUNTER — Ambulatory Visit: Payer: BC Managed Care – PPO | Admitting: Family Medicine

## 2020-03-19 ENCOUNTER — Encounter: Payer: Self-pay | Admitting: Family Medicine

## 2020-03-19 VITALS — BP 130/78 | HR 80 | Ht 67.0 in | Wt 191.0 lb

## 2020-03-19 DIAGNOSIS — Z7689 Persons encountering health services in other specified circumstances: Secondary | ICD-10-CM

## 2020-03-19 NOTE — Patient Instructions (Signed)

## 2020-03-19 NOTE — Progress Notes (Signed)
Date:  03/19/2020   Name:  Ashley Silva   DOB:  1975-10-31   MRN:  [id number]   Chief Complaint: Establish Care (Get established- will set up physical)  Patient is a 45 year old female who presents for a establish care exam. The patient reports the following problems: . Health maintenance has been reviewed will be pending.anxiety/depression.   Lab Results  Component Value Date   CREATININE 0.65 10/26/2017   BUN 13 10/26/2017   NA 139 10/26/2017   K 3.8 10/26/2017   CL 105 10/26/2017   CO2 24 10/26/2017   No results found for: CHOL, HDL, LDLCALC, LDLDIRECT, TRIG, CHOLHDL No results found for: TSH No results found for: HGBA1C Lab Results  Component Value Date   WBC 6.3 10/26/2017   HGB 9.1 (L) 10/26/2017   HCT 28.6 (L) 10/26/2017   MCV 62.1 (L) 10/26/2017   PLT 308 10/26/2017   No results found for: ALT, AST, GGT, ALKPHOS, BILITOT   Review of Systems  Constitutional: Negative.  Negative for chills, fatigue, fever and unexpected weight change.  HENT: Negative for congestion, ear discharge, ear pain, rhinorrhea, sinus pressure, sneezing and sore throat.   Eyes: Negative for double vision, photophobia, pain, discharge, redness and itching.  Respiratory: Negative for cough, shortness of breath, wheezing and stridor.   Gastrointestinal: Negative for abdominal pain, blood in stool, constipation, diarrhea, nausea and vomiting.  Endocrine: Negative for cold intolerance, heat intolerance, polydipsia, polyphagia and polyuria.  Genitourinary: Negative for dysuria, flank pain, frequency, hematuria, menstrual problem, pelvic pain, urgency, vaginal bleeding and vaginal discharge.  Musculoskeletal: Negative for arthralgias, back pain and myalgias.  Skin: Negative for rash.  Allergic/Immunologic: Negative for environmental allergies and food allergies.  Neurological: Negative for dizziness, weakness, light-headedness, numbness and headaches.  Hematological: Negative for adenopathy. Does  not bruise/bleed easily.  Psychiatric/Behavioral: Negative for dysphoric mood. The patient is not nervous/anxious.     There are no problems to display for this patient.   No Known Allergies  Past Surgical History:  Procedure Laterality Date  . CESAREAN SECTION     1    Social History   Tobacco Use  . Smoking status: Former Smoker    Years: 5.00    Quit date: 01/23/1996    Years since quitting: 24.1  . Smokeless tobacco: Never Used  Vaping Use  . Vaping Use: Never used  Substance Use Topics  . Alcohol use: Yes    Comment: occasionally  . Drug use: Never     Medication list has been reviewed and updated.  No outpatient medications have been marked as taking for the 03/19/20 encounter (Office Visit) with Juline Patch, MD.    [facility] 2/9 Scores 03/19/2020  PHQ - 2 Score 1  PHQ- 9 Score 7    GAD 7 : Generalized Anxiety Score 03/19/2020  Nervous, Anxious, on Edge 2  Control/stop worrying 1  Worry too much - different things 1  Trouble relaxing 1  Restless 1  Easily annoyed or irritable 2  Afraid - awful might happen 0  Total GAD 7 Score 8  Anxiety Difficulty Somewhat difficult    BP Readings from Last 3 Encounters:  03/19/20 130/78  01/23/20 (!) 152/87  01/07/19 (!) 165/93    Physical Exam Vitals and nursing note reviewed.  Constitutional:      Appearance: She is well-developed and well-nourished.  HENT:     Head: Normocephalic.     Right Ear: Tympanic membrane, ear canal and external ear  normal. There is no impacted cerumen.     Left Ear: Tympanic membrane, ear canal and external ear normal. There is no impacted cerumen.     Nose: Nose normal. No congestion or rhinorrhea.     Mouth/Throat:     Mouth: Oropharynx is clear and moist.  Eyes:     General: Lids are everted, no foreign bodies appreciated. No scleral icterus.       Left eye: No foreign body or hordeolum.     Extraocular Movements: EOM normal.     Conjunctiva/sclera: Conjunctivae normal.      Right eye: Right conjunctiva is not injected.     Left eye: Left conjunctiva is not injected.     Pupils: Pupils are equal, round, and reactive to light.  Neck:     Thyroid: No thyromegaly.     Vascular: No JVD.     Trachea: No tracheal deviation.  Cardiovascular:     Rate and Rhythm: Normal rate and regular rhythm.     Chest Wall: PMI is not displaced. No thrill.     Pulses: Normal pulses and intact distal pulses.     Heart sounds: Normal heart sounds. No murmur heard. No friction rub. No gallop. No S3 or S4 sounds.   Pulmonary:     Effort: Pulmonary effort is normal. No respiratory distress.     Breath sounds: Normal breath sounds. No stridor. No wheezing, rhonchi or rales.  Abdominal:     General: Bowel sounds are normal.     Palpations: Abdomen is soft. There is no hepatosplenomegaly or mass.     Tenderness: There is no abdominal tenderness. There is no guarding or rebound.  Musculoskeletal:        General: No tenderness or edema. Normal range of motion.     Cervical back: Normal range of motion and neck supple.     Right lower leg: No edema.     Left lower leg: No edema.  Lymphadenopathy:     Cervical: No cervical adenopathy.  Skin:    General: Skin is warm.     Findings: No rash.  Neurological:     Mental Status: She is alert and oriented to person, place, and time.     Cranial Nerves: No cranial nerve deficit.     Deep Tendon Reflexes: Strength normal. Reflexes normal.  Psychiatric:        Mood and Affect: Mood and affect normal. Mood is not anxious or depressed.     Wt Readings from Last 3 Encounters:  03/19/20 191 lb (86.6 kg)  01/23/20 185 lb (83.9 kg)  01/07/19 205 lb (93 kg)    BP 130/78   Pulse 80   Ht 5\' 7"  (1.702 m)   Wt 191 lb (86.6 kg)   LMP 03/08/2020 (Approximate)   BMI 29.91 kg/m   Assessment and Plan:  1. Establishing care with new doctor, encounter for Patient establishing care with new physician.  Patient's chart was reviewed from most  recent labs, imaging, and care everywhere.  Discussion the PHQ and gad at this time and patient will return at which time we will discuss other options.  General reviewed turning for health maintenance in the future.

## 2020-03-25 ENCOUNTER — Encounter: Payer: Self-pay | Admitting: Family Medicine

## 2020-03-25 ENCOUNTER — Other Ambulatory Visit (HOSPITAL_COMMUNITY)
Admission: RE | Admit: 2020-03-25 | Discharge: 2020-03-25 | Disposition: A | Discharge status: Home / Self Care | Payer: BC Managed Care – PPO | Source: Ambulatory Visit | Attending: Family Medicine | Admitting: Family Medicine

## 2020-03-25 ENCOUNTER — Ambulatory Visit (INDEPENDENT_AMBULATORY_CARE_PROVIDER_SITE_OTHER): Payer: BC Managed Care – PPO | Admitting: Family Medicine

## 2020-03-25 ENCOUNTER — Other Ambulatory Visit: Payer: Self-pay

## 2020-03-25 VITALS — BP 142/80 | HR 94 | Ht 67.0 in | Wt 192.0 lb

## 2020-03-25 DIAGNOSIS — Z0000 Encounter for general adult medical examination without abnormal findings: Secondary | ICD-10-CM

## 2020-03-25 DIAGNOSIS — Z862 Personal history of diseases of the blood and blood-forming organs and certain disorders involving the immune mechanism: Secondary | ICD-10-CM | POA: Diagnosis not present

## 2020-03-25 DIAGNOSIS — Z1231 Encounter for screening mammogram for malignant neoplasm of breast: Secondary | ICD-10-CM

## 2020-03-25 DIAGNOSIS — Z124 Encounter for screening for malignant neoplasm of cervix: Secondary | ICD-10-CM

## 2020-03-25 DIAGNOSIS — D649 Anemia, unspecified: Secondary | ICD-10-CM | POA: Diagnosis not present

## 2020-03-25 NOTE — Progress Notes (Signed)
Date:  03/25/2020   Name:  Ashley Silva   DOB:  August 31, 1975   MRN:  [id number]   Chief Complaint: Annual Exam (Pap and mammo- having some tenderness on c-section scar/ "not deep")  Patient is a 45 year old female who presents for a comprehensive physical exam. The patient reports the following problems: pain c section scar. Health maintenance has been reviewed pap due.   Lab Results  Component Value Date   CREATININE 0.65 10/26/2017   BUN 13 10/26/2017   NA 139 10/26/2017   K 3.8 10/26/2017   CL 105 10/26/2017   CO2 24 10/26/2017   No results found for: CHOL, HDL, LDLCALC, LDLDIRECT, TRIG, CHOLHDL No results found for: TSH No results found for: HGBA1C Lab Results  Component Value Date   WBC 6.3 10/26/2017   HGB 9.1 (L) 10/26/2017   HCT 28.6 (L) 10/26/2017   MCV 62.1 (L) 10/26/2017   PLT 308 10/26/2017   No results found for: ALT, AST, GGT, ALKPHOS, BILITOT   Review of Systems  Constitutional: Negative.  Negative for chills, fatigue, fever and unexpected weight change.  HENT: Negative for congestion, ear discharge, ear pain, rhinorrhea, sinus pressure, sneezing and sore throat.   Eyes: Negative for double vision, photophobia, pain, discharge, redness and itching.  Respiratory: Negative for cough, shortness of breath, wheezing and stridor.   Gastrointestinal: Negative for abdominal pain, blood in stool, constipation, diarrhea, nausea and vomiting.  Endocrine: Negative for cold intolerance, heat intolerance, polydipsia, polyphagia and polyuria.  Genitourinary: Negative for dysuria, flank pain, frequency, hematuria, menstrual problem, pelvic pain, urgency, vaginal bleeding and vaginal discharge.  Musculoskeletal: Negative for arthralgias, back pain and myalgias.  Skin: Negative for rash.       Post scar/surgical discomfort  Allergic/Immunologic: Negative for environmental allergies and food allergies.  Neurological: Negative for dizziness, weakness, light-headedness,  numbness and headaches.  Hematological: Negative for adenopathy. Does not bruise/bleed easily.  Psychiatric/Behavioral: Negative for dysphoric mood. The patient is not nervous/anxious.     There are no problems to display for this patient.   No Known Allergies  Past Surgical History:  Procedure Laterality Date  . CESAREAN SECTION     1    Social History   Tobacco Use  . Smoking status: Former Smoker    Years: 5.00    Quit date: 01/23/1996    Years since quitting: 24.1  . Smokeless tobacco: Never Used  Vaping Use  . Vaping Use: Never used  Substance Use Topics  . Alcohol use: Yes    Comment: occasionally  . Drug use: Never     Medication list has been reviewed and updated.  No outpatient medications have been marked as taking for the 03/25/20 encounter (Office Visit) with Juline Patch, MD.    [facility] 2/9 Scores 03/19/2020  PHQ - 2 Score 1  PHQ- 9 Score 7    GAD 7 : Generalized Anxiety Score 03/19/2020  Nervous, Anxious, on Edge 2  Control/stop worrying 1  Worry too much - different things 1  Trouble relaxing 1  Restless 1  Easily annoyed or irritable 2  Afraid - awful might happen 0  Total GAD 7 Score 8  Anxiety Difficulty Somewhat difficult    BP Readings from Last 3 Encounters:  03/25/20 (!) 142/80  03/19/20 130/78  01/23/20 (!) 152/87    Physical Exam Vitals and nursing note reviewed. Exam conducted with a chaperone present.  Constitutional:      General: She is not in  acute distress.    Appearance: Normal appearance. She is well-developed and well-groomed. She is not diaphoretic.  HENT:     Head: Normocephalic and atraumatic.     Jaw: There is normal jaw occlusion.     Right Ear: Hearing, tympanic membrane, ear canal and external ear normal.     Left Ear: Hearing, tympanic membrane, ear canal and external ear normal.     Nose: Nose normal.     Right Turbinates: Not enlarged or swollen.     Left Turbinates: Not enlarged or swollen.      Mouth/Throat:     Lips: Pink.     Mouth: Oropharynx is clear and moist. Mucous membranes are moist.     Tongue: No lesions.     Palate: No mass.     Pharynx: Oropharynx is clear. Uvula midline.  Eyes:     General: Lids are normal. Vision grossly intact. Gaze aligned appropriately.        Right eye: No discharge.        Left eye: No discharge.     Extraocular Movements: EOM normal.     Conjunctiva/sclera: Conjunctivae normal.     Pupils: Pupils are equal, round, and reactive to light.     Funduscopic exam:    Right eye: Red reflex present.        Left eye: Red reflex present. Neck:     Thyroid: No thyroid mass, thyromegaly or thyroid tenderness.     Vascular: Normal carotid pulses. No carotid bruit, hepatojugular reflux or JVD.     Trachea: Trachea and phonation normal.  Cardiovascular:     Rate and Rhythm: Normal rate and regular rhythm.     Pulses: Normal pulses and intact distal pulses.          Carotid pulses are 2+ on the right side and 2+ on the left side.      Radial pulses are 2+ on the right side and 2+ on the left side.       Femoral pulses are 2+ on the right side and 2+ on the left side.      Popliteal pulses are 2+ on the right side and 2+ on the left side.       Dorsalis pedis pulses are 2+ on the right side and 2+ on the left side.       Posterior tibial pulses are 2+ on the right side and 2+ on the left side.     Heart sounds: Normal heart sounds, S1 normal and S2 normal. No murmur heard.  No systolic murmur is present.  No diastolic murmur is present. No friction rub. No gallop. No S3 or S4 sounds.   Pulmonary:     Effort: Pulmonary effort is normal.     Breath sounds: Normal breath sounds. No decreased breath sounds, wheezing, rhonchi or rales.  Chest:     Chest wall: No mass.  Breasts: Breasts are symmetrical.     Right: Normal. No swelling, bleeding, inverted nipple, mass, nipple discharge, skin change, tenderness, axillary adenopathy or supraclavicular  adenopathy.     Left: Normal. No swelling, bleeding, inverted nipple, mass, nipple discharge, skin change, tenderness, axillary adenopathy or supraclavicular adenopathy.    Abdominal:     General: Bowel sounds are normal.     Palpations: Abdomen is soft. There is no hepatomegaly, splenomegaly or mass.     Tenderness: There is no abdominal tenderness. There is no guarding.     Hernia: There is no hernia  in the left inguinal area or right inguinal area.  Genitourinary:    General: Normal vulva.     Exam position: Supine.     Labia:        Right: No rash, tenderness or lesion.        Left: No rash, tenderness or lesion.      Vagina: Normal.     Cervix: Normal.     Uterus: Normal.      Adnexa: Right adnexa normal and left adnexa normal.     Rectum: Normal. Guaiac result negative. No mass.  Musculoskeletal:        General: No edema. Normal range of motion.     Cervical back: Full passive range of motion without pain, normal range of motion and neck supple.     Right lower leg: No edema.     Left lower leg: No edema.  Lymphadenopathy:     Head:     Right side of head: No submental or submandibular adenopathy.     Left side of head: No submental or submandibular adenopathy.     Cervical: No cervical adenopathy.     Right cervical: No superficial, deep or posterior cervical adenopathy.    Left cervical: No superficial, deep or posterior cervical adenopathy.     Upper Body:     Right upper body: No supraclavicular or axillary adenopathy.     Left upper body: No supraclavicular or axillary adenopathy.     Lower Body: No right inguinal adenopathy. No left inguinal adenopathy.  Skin:    General: Skin is warm and dry.     Capillary Refill: Capillary refill takes less than 2 seconds.  Neurological:     General: No focal deficit present.     Mental Status: She is alert and oriented to person, place, and time.     Cranial Nerves: Cranial nerves are intact.     Sensory: Sensation is intact.      Motor: Motor function is intact.     Deep Tendon Reflexes: Reflexes are normal and symmetric.  Psychiatric:        Attention and Perception: Attention and perception normal.        Mood and Affect: Mood and affect normal.     Wt Readings from Last 3 Encounters:  03/25/20 192 lb (87.1 kg)  03/19/20 191 lb (86.6 kg)  01/23/20 185 lb (83.9 kg)    BP (!) 142/80   Pulse 94   Ht 5\' 7"  (1.702 m)   Wt 192 lb (87.1 kg)   LMP 03/08/2020 (Approximate)   SpO2 100%   BMI 30.07 kg/m   Assessment and Plan: 1. Annual physical exam No subjective/objective concerns noted during history and physical exam.  Patient's previous medical history was reviewed as well as most recent labs most recent imaging and care everywhere.Trysten Berti is a 45 y.o. female who presents today for her Complete Annual Exam. She feels well. She reports exercising . She reports she is sleeping well. Immunizations are reviewed and recommendations provided.   Age appropriate screening tests are discussed. Counseling given for risk factor reduction interventions.  We will obtain CMP, CBC, and lipid panel. - Comprehensive metabolic panel - CBC with Differential/Platelet - Lipid Panel With LDL/HDL Ratio - Cytology - PAP  2. Encounter for Papanicolaou smear for cervical cancer screening Patient underwent screening for cervical cancer and also bimanual exam.  External was normal as well as bimanual palpation with normal adnexa and uterus.  Pap smear was obtained.  Cervix had normal appearance - Cytology - PAP  3. Breast cancer screening by mammogram Breast exam was normal and we will schedule for bilateral breast mammogram. - MM 3D SCREEN BREAST BILATERAL; Future  4. History of anemia It was noted to have a history of anemia on a previous likely C-section and we will recheck hemoglobin to make sure this has returned to normal. - CBC with Differential/Platelet

## 2020-03-26 ENCOUNTER — Encounter: Payer: BC Managed Care – PPO | Admitting: Family Medicine

## 2020-03-26 ENCOUNTER — Other Ambulatory Visit: Payer: Self-pay

## 2020-03-26 DIAGNOSIS — Z862 Personal history of diseases of the blood and blood-forming organs and certain disorders involving the immune mechanism: Secondary | ICD-10-CM

## 2020-03-26 DIAGNOSIS — N920 Excessive and frequent menstruation with regular cycle: Secondary | ICD-10-CM

## 2020-03-26 LAB — COMPREHENSIVE METABOLIC PANEL
ALT: 11 IU/L (ref 0–32)
AST: 17 IU/L (ref 0–40)
Albumin/Globulin Ratio: 1.7 (ref 1.2–2.2)
Albumin: 4.8 g/dL (ref 3.8–4.8)
Alkaline Phosphatase: 78 IU/L (ref 44–121)
BUN/Creatinine Ratio: 15 (ref 9–23)
BUN: 12 mg/dL (ref 6–24)
Bilirubin Total: 0.3 mg/dL (ref 0.0–1.2)
CO2: 23 mmol/L (ref 20–29)
Calcium: 9.8 mg/dL (ref 8.7–10.2)
Chloride: 102 mmol/L (ref 96–106)
Creatinine, Ser: 0.79 mg/dL (ref 0.57–1.00)
GFR calc Af Amer: 105 mL/min/{1.73_m2} (ref >59)
GFR calc non Af Amer: 91 mL/min/{1.73_m2} (ref >59)
Globulin, Total: 2.8 g/dL (ref 1.5–4.5)
Glucose: 91 mg/dL (ref 65–99)
Potassium: 4.3 mmol/L (ref 3.5–5.2)
Sodium: 140 mmol/L (ref 134–144)
Total Protein: 7.6 g/dL (ref 6.0–8.5)

## 2020-03-26 LAB — CBC WITH DIFFERENTIAL/PLATELET
Basophils Absolute: 0.1 10*3/uL (ref 0.0–0.2)
Basos: 2 %
EOS (ABSOLUTE): 0.1 10*3/uL (ref 0.0–0.4)
Eos: 2 %
Hematocrit: 28.7 % — ABNORMAL LOW (ref 34.0–46.6)
Hemoglobin: 7.8 g/dL — ABNORMAL LOW (ref 11.1–15.9)
Immature Grans (Abs): 0 10*3/uL (ref 0.0–0.1)
Immature Granulocytes: 0 %
Lymphocytes Absolute: 1.7 10*3/uL (ref 0.7–3.1)
Lymphs: 31 %
MCH: 16.7 pg — ABNORMAL LOW (ref 26.6–33.0)
MCHC: 27.2 g/dL — ABNORMAL LOW (ref 31.5–35.7)
MCV: 62 fL — ABNORMAL LOW (ref 79–97)
Monocytes Absolute: 0.4 10*3/uL (ref 0.1–0.9)
Monocytes: 7 %
Neutrophils Absolute: 3.1 10*3/uL (ref 1.4–7.0)
Neutrophils: 58 %
Platelets: 306 10*3/uL (ref 150–450)
RBC: 4.67 x10E6/uL (ref 3.77–5.28)
RDW: 19.9 % — ABNORMAL HIGH (ref 11.7–15.4)
WBC: 5.3 10*3/uL (ref 3.4–10.8)

## 2020-03-26 LAB — CYTOLOGY - PAP
Adequacy: ABSENT
Comment: NEGATIVE
Diagnosis: NEGATIVE
High risk HPV: NEGATIVE

## 2020-03-26 LAB — LIPID PANEL WITH LDL/HDL RATIO
Cholesterol, Total: 222 mg/dL — ABNORMAL HIGH (ref 100–199)
HDL: 56 mg/dL (ref >39)
LDL Chol Calc (NIH): 140 mg/dL — ABNORMAL HIGH (ref 0–99)
LDL/HDL Ratio: 2.5 ratio (ref 0.0–3.2)
Triglycerides: 147 mg/dL (ref 0–149)
VLDL Cholesterol Cal: 26 mg/dL (ref 5–40)

## 2020-03-26 NOTE — Progress Notes (Signed)
Ref placed to GYN 

## 2020-03-29 ENCOUNTER — Other Ambulatory Visit (INDEPENDENT_AMBULATORY_CARE_PROVIDER_SITE_OTHER): Payer: BC Managed Care – PPO

## 2020-03-29 ENCOUNTER — Other Ambulatory Visit: Payer: Self-pay

## 2020-03-29 DIAGNOSIS — Z862 Personal history of diseases of the blood and blood-forming organs and certain disorders involving the immune mechanism: Secondary | ICD-10-CM | POA: Diagnosis not present

## 2020-03-29 LAB — IRON AND TIBC
Iron Saturation: 3 % — CL (ref 15–55)
Iron: 16 ug/dL — ABNORMAL LOW (ref 27–159)
Total Iron Binding Capacity: 501 ug/dL — ABNORMAL HIGH (ref 250–450)
UIBC: 485 ug/dL — ABNORMAL HIGH (ref 131–425)

## 2020-03-29 LAB — HEMOCCULT GUIAC POC 1CARD (OFFICE)
Card #2 Fecal Occult Blod, POC: NEGATIVE
Card #3 Fecal Occult Blood, POC: NEGATIVE
Fecal Occult Blood, POC: NEGATIVE

## 2020-03-29 LAB — SPECIMEN STATUS REPORT

## 2020-03-29 LAB — FERRITIN: Ferritin: 1 ng/mL — ABNORMAL LOW (ref 15–150)

## 2020-03-30 ENCOUNTER — Other Ambulatory Visit: Payer: Self-pay

## 2020-03-30 DIAGNOSIS — Z862 Personal history of diseases of the blood and blood-forming organs and certain disorders involving the immune mechanism: Secondary | ICD-10-CM

## 2020-03-30 MED ORDER — FERROUS SULFATE 325 (65 FE) MG PO TABS: 325.0000 mg | ORAL_TABLET | Freq: Every day | ORAL | 0 refills | Status: DC

## 2020-03-30 NOTE — Progress Notes (Unsigned)
Sent in iron supp

## 2020-03-31 ENCOUNTER — Other Ambulatory Visit: Payer: Self-pay

## 2020-03-31 ENCOUNTER — Encounter: Payer: Self-pay | Admitting: Obstetrics and Gynecology

## 2020-03-31 ENCOUNTER — Ambulatory Visit: Payer: BC Managed Care – PPO | Admitting: Obstetrics and Gynecology

## 2020-03-31 VITALS — BP 146/73 | Ht 67.0 in | Wt 189.0 lb

## 2020-03-31 DIAGNOSIS — N920 Excessive and frequent menstruation with regular cycle: Secondary | ICD-10-CM

## 2020-03-31 DIAGNOSIS — D500 Iron deficiency anemia secondary to blood loss (chronic): Secondary | ICD-10-CM

## 2020-03-31 MED ORDER — MEDROXYPROGESTERONE ACETATE 10 MG PO TABS: 20.0000 mg | ORAL_TABLET | Freq: Every day | ORAL | 0 refills | Status: DC

## 2020-03-31 MED ORDER — TRANEXAMIC ACID 650 MG PO TABS
1300.0000 mg | ORAL_TABLET | Freq: Three times a day (TID) | ORAL | 2 refills | Status: DC
Start: 2020-03-31 — End: 2020-09-30

## 2020-03-31 NOTE — Progress Notes (Signed)
Obstetrics & Gynecology Office Visit   Chief Complaint  Patient presents with  . Menorrhagia   The patient is seen in referral at the request of Juline Patch, MD from [facility] for heavy menstrual bleeding.   History of Present Illness: 45 y.o. G14P2002 female who is seen in referral from Juline Patch, MD from [facility] for heavy menstrual bleeding.    She presents due to significant anemia noted on a recent CBC.  She has regular periods, coming about every 28 days, lasting 6-7 days.  She states her periods are very heavy.  Days 2-3 (sometimes day 4) are really bad and then the bleeding just tapers off.  She goes through a pad and a tampon every 1.5 hours.  She denies intermenstrual bleeding.  She uses nothing for contraception.  She is sexually active.    She has intentional weight loss of 20 lbs.  She denies new constipation.  She notes some early satiety, though she states that she has changed the way she eats.  She notes some bloating.  Since the birth of her son in 2014 her periods have been very heavy.  She has never had any treatment for this.  She does not feeling tired, sometimes jittery, faint and dizzy.    Pap smear 03/25/20: NILM, HPV negative CBC 03/25/20: hgb 7.8, hct 28.7, iron studies indicate iron deficiency She denies a history of STIs.   Past Medical History:  Diagnosis Date  . Hypoglycemia    Past Surgical History:  Procedure Laterality Date  . CESAREAN SECTION     1   Gynecologic History: Patient's last menstrual period was 03/08/2020 (approximate).  Obstetric History: [id number]  Family History  Problem Relation Age of Onset  . Hypertension Mother   . Other Father        unknown medical history  . Heart disease Maternal Grandmother   . Stroke Maternal Grandfather     Social History   Socioeconomic History  . Marital status: Married    Spouse name: Not on file  . Number of children: Not on file  . Years of education: Not  on file  . Highest education level: Not on file  Occupational History  . Not on file  Tobacco Use  . Smoking status: Former Smoker    Years: 5.00    Quit date: 01/23/1996    Years since quitting: 24.2  . Smokeless tobacco: Never Used  Vaping Use  . Vaping Use: Never used  Substance and Sexual Activity  . Alcohol use: Yes    Comment: occasionally  . Drug use: Never  . Sexual activity: Yes  Other Topics Concern  . Not on file  Social History Narrative  . Not on file   Social Determinants of Health   Financial Resource Strain: Not on file  Food Insecurity: Not on file  Transportation Needs: Not on file  Physical Activity: Not on file  Stress: Not on file  Social Connections: Not on file  Intimate Partner Violence: Not on file   Allergies: No Known Allergies  Prior to Admission medications   Medication Sig Start Date End Date Taking? Authorizing Provider  ferrous sulfate 325 (65 FE) MG tablet Take 1 tablet (325 mg total) by mouth daily with breakfast. 03/30/20  Yes Juline Patch, MD    Review of Systems  Constitutional: Negative.   HENT: Negative.   Eyes: Negative.   Respiratory: Negative.   Cardiovascular: Negative.   Gastrointestinal: Negative.  Genitourinary: Negative.   Musculoskeletal: Negative.   Skin: Negative.   Neurological: Negative.   Psychiatric/Behavioral: Negative.      Physical Exam BP (!) 146/73   Ht 5\' 7"  (1.702 m)   Wt 189 lb (85.7 kg)   LMP 03/08/2020 (Approximate)   BMI 29.60 kg/m  Patient's last menstrual period was 03/08/2020 (approximate). Physical Exam Constitutional:      General: She is not in acute distress.    Appearance: Normal appearance. She is well-developed.  HENT:     Head: Normocephalic and atraumatic.  Eyes:     General: No scleral icterus.    Conjunctiva/sclera: Conjunctivae normal.  Cardiovascular:     Rate and Rhythm: Normal rate and regular rhythm.     Heart sounds: No murmur heard. No friction rub. No  gallop.   Pulmonary:     Effort: Pulmonary effort is normal. No respiratory distress.     Breath sounds: Normal breath sounds. No wheezing or rales.  Abdominal:     General: Bowel sounds are normal. There is no distension.     Palpations: Abdomen is soft. There is no mass.     Tenderness: There is no abdominal tenderness. There is no guarding or rebound.  Musculoskeletal:        General: Normal range of motion.     Cervical back: Normal range of motion and neck supple.  Neurological:     General: No focal deficit present.     Mental Status: She is alert and oriented to person, place, and time.     Cranial Nerves: No cranial nerve deficit.  Skin:    General: Skin is warm and dry.     Findings: No erythema.  Psychiatric:        Mood and Affect: Mood normal.        Behavior: Behavior normal.        Judgment: Judgment normal.     Female chaperone present for pelvic and breast  portions of the physical exam  Assessment: 45 y.o. G74P2002 female here for  1. Menorrhagia with regular cycle   2. Iron deficiency anemia due to chronic blood loss      Plan: Problem List Items Addressed This Visit   None   Visit Diagnoses    Menorrhagia with regular cycle    -  Primary   Relevant Medications   medroxyPROGESTERone (PROVERA) 10 MG tablet   tranexamic acid (LYSTEDA) 650 MG TABS tablet   Other Relevant Orders   US PELVIS TRANSVAGINAL NON-OB (TV ONLY)   Iron deficiency anemia due to chronic blood loss       Relevant Medications   medroxyPROGESTERone (PROVERA) 10 MG tablet   tranexamic acid (LYSTEDA) 650 MG TABS tablet   Other Relevant Orders   US PELVIS TRANSVAGINAL NON-OB (TV ONLY)     Discussed management options for abnormal uterine bleeding including expectant, NSAIDs, tranexamic acid (Lysteda), oral progesterone (Provera, norethindrone, megace), Depo Provera, Levonorgestrel containing IUD, endometrial ablation (Novasure) or hysterectomy as definitive surgical management.   Discussed risks and benefits of each method.   Final management decision will hinge on results of patient's work up and whether an underlying etiology for the patients bleeding symptoms can be discerned.  We will conduct a basic work up examining using the PALM-COIEN classification system.  In the meantime the patient opts to trial Provera and Lysteda while we await results of her ultrasound and labs.  Printed patient education handouts were given to the patient to review at home.  Bleeding precautions reviewed.   A total of 30 minutes were spent face-to-face with the patient as well as preparation, review, communication, and documentation during this encounter.    Prentice Docker, MD 03/31/2020 3:40 PM    CC: Juline Patch, MD [address] [place],  [place] [address]

## 2020-04-07 ENCOUNTER — Other Ambulatory Visit: Payer: Self-pay

## 2020-04-07 ENCOUNTER — Ambulatory Visit
Admission: RE | Admit: 2020-04-07 | Discharge: 2020-04-07 | Disposition: A | Discharge status: Home / Self Care | Payer: BC Managed Care – PPO | Source: Ambulatory Visit | Attending: Family Medicine | Admitting: Family Medicine

## 2020-04-07 DIAGNOSIS — Z1231 Encounter for screening mammogram for malignant neoplasm of breast: Secondary | ICD-10-CM | POA: Diagnosis not present

## 2020-04-12 ENCOUNTER — Encounter: Payer: Self-pay | Admitting: Family Medicine

## 2020-04-12 ENCOUNTER — Ambulatory Visit: Payer: BC Managed Care – PPO | Admitting: Family Medicine

## 2020-04-12 ENCOUNTER — Other Ambulatory Visit: Payer: Self-pay

## 2020-04-12 VITALS — BP 148/74 | HR 92 | Ht 67.0 in | Wt 191.0 lb

## 2020-04-12 DIAGNOSIS — D500 Iron deficiency anemia secondary to blood loss (chronic): Secondary | ICD-10-CM | POA: Diagnosis not present

## 2020-04-12 DIAGNOSIS — N920 Excessive and frequent menstruation with regular cycle: Secondary | ICD-10-CM | POA: Diagnosis not present

## 2020-04-12 DIAGNOSIS — R5383 Other fatigue: Secondary | ICD-10-CM

## 2020-04-12 DIAGNOSIS — E611 Iron deficiency: Secondary | ICD-10-CM | POA: Diagnosis not present

## 2020-04-12 NOTE — Progress Notes (Signed)
Date:  04/12/2020   Name:  Ashley Silva   DOB:  Oct 31, 1975   MRN:  [id number]   Chief Complaint: Anemia (Follow up.)  Anemia Presents for follow-up visit. There has been no abdominal pain, anorexia, bruising/bleeding easily, confusion, fever, leg swelling, light-headedness, malaise/fatigue, pallor, palpitations, paresthesias, pica or weight loss. Signs of blood loss that are present include menorrhagia and vaginal bleeding. Signs of blood loss that are not present include hematemesis, hematochezia and melena.  Thyroid Problem Presents for initial (for fatigue) visit. Symptoms include dry skin and hair loss. Patient reports no anxiety, cold intolerance, constipation, depressed mood, diaphoresis, diarrhea, fatigue, heat intolerance, hoarse voice, menstrual problem, palpitations, weight gain or weight loss. The symptoms have been worsening.      Lab Results  Component Value Date   CREATININE 0.79 03/25/2020   BUN 12 03/25/2020   NA 140 03/25/2020   K 4.3 03/25/2020   CL 102 03/25/2020   CO2 23 03/25/2020   Lab Results  Component Value Date   CHOL 222 (H) 03/25/2020   HDL 56 03/25/2020   LDLCALC 140 (H) 03/25/2020   TRIG 147 03/25/2020   No results found for: TSH No results found for: HGBA1C Lab Results  Component Value Date   WBC 5.3 03/25/2020   HGB 7.8 (L) 03/25/2020   HCT 28.7 (L) 03/25/2020   MCV 62 (L) 03/25/2020   PLT 306 03/25/2020   Lab Results  Component Value Date   ALT 11 03/25/2020   AST 17 03/25/2020   ALKPHOS 78 03/25/2020   BILITOT 0.3 03/25/2020     Review of Systems  Constitutional: Negative.  Negative for chills, diaphoresis, fatigue, fever, malaise/fatigue, unexpected weight change, weight gain and weight loss.  HENT: Negative for congestion, ear discharge, ear pain, hoarse voice, rhinorrhea, sinus pressure, sneezing and sore throat.   Eyes: Negative for photophobia, pain, discharge, redness and itching.  Respiratory: Negative for cough,  shortness of breath, wheezing and stridor.   Cardiovascular: Negative for palpitations.  Gastrointestinal: Negative for abdominal pain, anorexia, blood in stool, constipation, diarrhea, hematemesis, hematochezia, melena, nausea and vomiting.  Endocrine: Negative for cold intolerance, heat intolerance, polydipsia, polyphagia and polyuria.  Genitourinary: Positive for menorrhagia and vaginal bleeding. Negative for dysuria, flank pain, frequency, hematuria, menstrual problem, pelvic pain, urgency and vaginal discharge.  Musculoskeletal: Negative for arthralgias, back pain and myalgias.  Skin: Negative for pallor and rash.  Allergic/Immunologic: Negative for environmental allergies and food allergies.  Neurological: Negative for dizziness, weakness, light-headedness, numbness, headaches and paresthesias.  Hematological: Negative for adenopathy. Does not bruise/bleed easily.  Psychiatric/Behavioral: Negative for confusion and dysphoric mood. The patient is not nervous/anxious.     Patient Active Problem List   Diagnosis Date Noted  . Seborrheic keratoses 06/16/2019    No Known Allergies  Past Surgical History:  Procedure Laterality Date  . CESAREAN SECTION     1    Social History   Tobacco Use  . Smoking status: Former Smoker    Years: 5.00    Quit date: 01/23/1996    Years since quitting: 24.2  . Smokeless tobacco: Never Used  Vaping Use  . Vaping Use: Never used  Substance Use Topics  . Alcohol use: Yes    Comment: occasionally  . Drug use: Never     Medication list has been reviewed and updated.  Current Meds  Medication Sig  . ferrous sulfate 325 (65 FE) MG tablet Take 1 tablet (325 mg total) by mouth daily with breakfast.  .  medroxyPROGESTERone (PROVERA) 10 MG tablet Take 2 tablets (20 mg total) by mouth daily for 10 days.  . tranexamic acid (LYSTEDA) 650 MG TABS tablet Take 2 tablets (1,300 mg total) by mouth 3 (three) times daily. Take during menses for a maximum of  five days    PHQ 2/9 Scores 04/12/2020 03/19/2020  PHQ - 2 Score 1 1  PHQ- 9 Score 7 7    GAD 7 : Generalized Anxiety Score 04/12/2020 03/19/2020  Nervous, Anxious, on Edge 2 2  Control/stop worrying 1 1  Worry too much - different things 1 1  Trouble relaxing 1 1  Restless 1 1  Easily annoyed or irritable 2 2  Afraid - awful might happen 0 0  Total GAD 7 Score 8 8  Anxiety Difficulty Not difficult at all Somewhat difficult    BP Readings from Last 3 Encounters:  04/12/20 (!) 148/74  03/31/20 (!) 146/73  03/25/20 (!) 142/80    Physical Exam Vitals and nursing note reviewed.  Constitutional:      General: She is not in acute distress.    Appearance: She is not diaphoretic.  HENT:     Head: Normocephalic and atraumatic.     Right Ear: External ear normal.     Left Ear: External ear normal.     Nose: Nose normal.  Eyes:     General:        Right eye: No discharge.        Left eye: No discharge.     Conjunctiva/sclera: Conjunctivae normal.     Pupils: Pupils are equal, round, and reactive to light.  Neck:     Thyroid: No thyromegaly.     Vascular: No JVD.  Cardiovascular:     Rate and Rhythm: Normal rate and regular rhythm.     Heart sounds: Normal heart sounds. No murmur heard. No friction rub. No gallop.   Pulmonary:     Effort: Pulmonary effort is normal.     Breath sounds: Normal breath sounds.  Abdominal:     General: Bowel sounds are normal.     Palpations: Abdomen is soft. There is no mass.     Tenderness: There is no abdominal tenderness. There is no guarding.  Musculoskeletal:        General: Normal range of motion.     Cervical back: Normal range of motion and neck supple.  Lymphadenopathy:     Cervical: No cervical adenopathy.  Skin:    General: Skin is warm and dry.  Neurological:     Mental Status: She is alert.     Deep Tendon Reflexes: Reflexes are normal and symmetric.     Wt Readings from Last 3 Encounters:  04/12/20 191 lb (86.6 kg)   03/31/20 189 lb (85.7 kg)  03/25/20 192 lb (87.1 kg)    BP (!) 148/74   Pulse 92   Ht 5\' 7"  (1.702 m)   Wt 191 lb (86.6 kg)   LMP 04/11/2020 (Exact Date)   SpO2 99%   BMI 29.91 kg/m   Assessment and Plan:  1. Anemia due to chronic blood loss New onset.  In the process of being corrected.  Patient has been started on iron replacement as seen below.    2. Menorrhagia with regular cycle Chronic.  Persistent.  Stable.  Patient has been referred to Dr. Glennon Mac gynecology for evaluation of which she is currently on medication as well as upcoming pelvic ultrasound.  3. Iron deficiency Chronic.  In process  of being controlled.  Patient underwent lab values which showed low ferritin and low iron in anemia with microcytic indices.  Ferrous sulfate 325 daily has been started and patient is in the process of above evaluation.  4. Fatigue, unspecified type Patient has noted history of fatigue with most recently noted significant hair loss.  There is no patches of alopecia.  We will obtain a TSH with thyroid panel for evaluation thereof. - Thyroid Panel With TSH

## 2020-04-13 ENCOUNTER — Ambulatory Visit (INDEPENDENT_AMBULATORY_CARE_PROVIDER_SITE_OTHER): Payer: BC Managed Care – PPO

## 2020-04-13 ENCOUNTER — Ambulatory Visit (INDEPENDENT_AMBULATORY_CARE_PROVIDER_SITE_OTHER): Payer: BC Managed Care – PPO | Admitting: Obstetrics and Gynecology

## 2020-04-13 VITALS — BP 124/74 | Ht 67.0 in

## 2020-04-13 DIAGNOSIS — N920 Excessive and frequent menstruation with regular cycle: Secondary | ICD-10-CM

## 2020-04-13 DIAGNOSIS — D500 Iron deficiency anemia secondary to blood loss (chronic): Secondary | ICD-10-CM

## 2020-04-13 DIAGNOSIS — N800 Endometriosis of uterus: Secondary | ICD-10-CM

## 2020-04-13 DIAGNOSIS — N8003 Adenomyosis of the uterus: Secondary | ICD-10-CM

## 2020-04-13 DIAGNOSIS — N840 Polyp of corpus uteri: Secondary | ICD-10-CM

## 2020-04-13 LAB — THYROID PANEL WITH TSH
Free Thyroxine Index: 1.6 (ref 1.2–4.9)
T3 Uptake Ratio: 20 % — ABNORMAL LOW (ref 24–39)
T4, Total: 7.8 ug/dL (ref 4.5–12.0)
TSH: 2.52 u[IU]/mL (ref 0.450–4.500)

## 2020-04-13 NOTE — Progress Notes (Signed)
Gynecology Ultrasound Follow Up   Chief Complaint  Patient presents with  . Follow-up  ultrasound for symptomatic menorrhagia with regular cycle, resultant anemia   History of Present Illness: Patient is a 45 y.o. female who presents today for ultrasound evaluation of the above .  Ultrasound demonstrates the following findings Adnexa: no masses seen  Uterus: anteverted with endometrial stripe  9.5 mm Additional: A single fibroid noted in the left, posterior myometrium, appears intramural.  Echotexture of myometrium is heterogeneous around endometrium, possible adenomyosis. Possible polyp measuring 1.5 cm.   Past Medical History:  Diagnosis Date  . Hypoglycemia     Past Surgical History:  Procedure Laterality Date  . CESAREAN SECTION     1   Family History  Problem Relation Age of Onset  . Hypertension Mother   . Other Father        unknown medical history  . Heart disease Maternal Grandmother   . Stroke Maternal Grandfather   . Breast cancer Neg Hx     Social History   Socioeconomic History  . Marital status: Married    Spouse name: Not on file  . Number of children: Not on file  . Years of education: Not on file  . Highest education level: Not on file  Occupational History  . Not on file  Tobacco Use  . Smoking status: Former Smoker    Years: 5.00    Quit date: 01/23/1996    Years since quitting: 24.2  . Smokeless tobacco: Never Used  Vaping Use  . Vaping Use: Never used  Substance and Sexual Activity  . Alcohol use: Yes    Comment: occasionally  . Drug use: Never  . Sexual activity: Yes  Other Topics Concern  . Not on file  Social History Narrative  . Not on file   Social Determinants of Health   Financial Resource Strain: Not on file  Food Insecurity: Not on file  Transportation Needs: Not on file  Physical Activity: Not on file  Stress: Not on file  Social Connections: Not on file  Intimate Partner Violence: Not on file    No Known  Allergies  Prior to Admission medications   Medication Sig Start Date End Date Taking? Authorizing Provider  ferrous sulfate 325 (65 FE) MG tablet Take 1 tablet (325 mg total) by mouth daily with breakfast. 03/30/20   Duanne Limerick, MD  medroxyPROGESTERone (PROVERA) 10 MG tablet Take 2 tablets (20 mg total) by mouth daily for 10 days. 03/31/20 04/10/20  Conard Novak, MD  tranexamic acid (LYSTEDA) 650 MG TABS tablet Take 2 tablets (1,300 mg total) by mouth 3 (three) times daily. Take during menses for a maximum of five days 03/31/20   Conard Novak, MD  albuterol (PROVENTIL HFA;VENTOLIN HFA) 108 (90 Base) MCG/ACT inhaler Inhale 2 puffs into the lungs every 6 (six) hours as needed for wheezing or shortness of breath. 10/26/17 01/07/19  Rockne Menghini, MD    Physical Exam BP 124/74   Ht 5\' 7"  (1.702 m)   LMP 04/11/2020 (Exact Date)   BMI 29.91 kg/m    General: NAD HEENT: normocephalic, anicteric Pulmonary: No increased work of breathing Extremities: no edema, erythema, or tenderness Neurologic: Grossly intact, normal gait Psychiatric: mood appropriate, affect full  Imaging Results No results found.  Assessment: 45 y.o. G2P2002  1. Menorrhagia with regular cycle   2. Iron deficiency anemia due to chronic blood loss   3. Adenomyosis   4. Endometrial polyp  Plan: Problem List Items Addressed This Visit   None   Visit Diagnoses    Menorrhagia with regular cycle    -  Primary   Iron deficiency anemia due to chronic blood loss       Adenomyosis       Endometrial polyp         Options: 1) SIS with IUD placement if no polyp. If polyp -> OR for polypectomy, IUD placement vs hyst 2) hsc, D&C/polpyx, IUD placement 3) TLH/BS  Discussed the pros/cons of each approach. Patient understands that a diagnosis of either adenomyosis or polyp aren't definite based on this scan. The above options represent minimal intervention to definitive management. She will consider and  let me know.  A total of 21 minutes were spent face-to-face with the patient as well as preparation, review, communication, and documentation during this encounter.    Thomasene Mohair, MD, [facility], [facility] 04/13/2020 3:33 PM

## 2020-04-14 ENCOUNTER — Encounter: Payer: Self-pay | Admitting: Obstetrics and Gynecology

## 2020-07-13 ENCOUNTER — Ambulatory Visit: Payer: BC Managed Care – PPO | Admitting: Family Medicine

## 2020-07-15 ENCOUNTER — Encounter: Payer: Self-pay | Admitting: Family Medicine

## 2020-07-15 ENCOUNTER — Other Ambulatory Visit: Payer: Self-pay

## 2020-07-15 ENCOUNTER — Ambulatory Visit: Payer: BC Managed Care – PPO | Admitting: Family Medicine

## 2020-07-15 VITALS — BP 134/80 | HR 72 | Ht 67.0 in | Wt 184.0 lb

## 2020-07-15 DIAGNOSIS — E611 Iron deficiency: Secondary | ICD-10-CM | POA: Diagnosis not present

## 2020-07-15 DIAGNOSIS — D500 Iron deficiency anemia secondary to blood loss (chronic): Secondary | ICD-10-CM | POA: Diagnosis not present

## 2020-07-15 DIAGNOSIS — N920 Excessive and frequent menstruation with regular cycle: Secondary | ICD-10-CM

## 2020-07-15 DIAGNOSIS — F5089 Other specified eating disorder: Secondary | ICD-10-CM

## 2020-07-15 NOTE — Progress Notes (Signed)
Date:  07/15/2020   Name:  Ashley Silva   DOB:  03-13-1975   MRN:  [id number]   Chief Complaint: Anemia (Not taking ferrous sulfate daily)  Anemia Presents for initial visit. Symptoms include malaise/fatigue. There has been no abdominal pain, anorexia, bruising/bleeding easily, confusion, fever, leg swelling, light-headedness, pallor, palpitations, paresthesias, pica or weight loss. Signs of blood loss that are present include menorrhagia. Signs of blood loss that are not present include hematemesis, hematochezia, melena and vaginal bleeding. Past treatments include oral iron supplements. There is no history of alcohol abuse, chronic renal disease, malnutrition or neuropathy.   Lab Results  Component Value Date   CREATININE 0.79 03/25/2020   BUN 12 03/25/2020   NA 140 03/25/2020   K 4.3 03/25/2020   CL 102 03/25/2020   CO2 23 03/25/2020   Lab Results  Component Value Date   CHOL 222 (H) 03/25/2020   HDL 56 03/25/2020   LDLCALC 140 (H) 03/25/2020   TRIG 147 03/25/2020   Lab Results  Component Value Date   TSH 2.520 04/12/2020   No results found for: HGBA1C Lab Results  Component Value Date   WBC 5.3 03/25/2020   HGB 7.8 (L) 03/25/2020   HCT 28.7 (L) 03/25/2020   MCV 62 (L) 03/25/2020   PLT 306 03/25/2020   Lab Results  Component Value Date   ALT 11 03/25/2020   AST 17 03/25/2020   ALKPHOS 78 03/25/2020   BILITOT 0.3 03/25/2020     Review of Systems  Constitutional:  Positive for malaise/fatigue. Negative for fever and weight loss.  Cardiovascular:  Negative for palpitations.  Gastrointestinal:  Negative for abdominal pain, anorexia, hematemesis, hematochezia and melena.  Genitourinary:  Positive for menorrhagia. Negative for vaginal bleeding.  Skin:  Negative for pallor.  Neurological:  Negative for light-headedness and paresthesias.  Hematological:  Does not bruise/bleed easily.  Psychiatric/Behavioral:  Negative for confusion.    Patient Active Problem  List   Diagnosis Date Noted   Seborrheic keratoses 06/16/2019    No Known Allergies  Past Surgical History:  Procedure Laterality Date   CESAREAN SECTION     1    Social History   Tobacco Use   Smoking status: Former    Years: 5.00    Pack years: 0.00    Types: Cigarettes    Quit date: 01/23/1996    Years since quitting: 24.4   Smokeless tobacco: Never  Vaping Use   Vaping Use: Never used  Substance Use Topics   Alcohol use: Yes    Comment: occasionally   Drug use: Never     Medication list has been reviewed and updated.  Current Meds  Medication Sig   ferrous sulfate 325 (65 FE) MG tablet Take 1 tablet (325 mg total) by mouth daily with breakfast.   tranexamic acid (LYSTEDA) 650 MG TABS tablet Take 2 tablets (1,300 mg total) by mouth 3 (three) times daily. Take during menses for a maximum of five days    PHQ 2/9 Scores 04/12/2020 03/19/2020  PHQ - 2 Score 1 1  PHQ- 9 Score 7 7    GAD 7 : Generalized Anxiety Score 04/12/2020 03/19/2020  Nervous, Anxious, on Edge 2 2  Control/stop worrying 1 1  Worry too much - different things 1 1  Trouble relaxing 1 1  Restless 1 1  Easily annoyed or irritable 2 2  Afraid - awful might happen 0 0  Total GAD 7 Score 8 8  Anxiety Difficulty Not  difficult at all Somewhat difficult    BP Readings from Last 3 Encounters:  07/15/20 134/80  04/13/20 124/74  04/12/20 (!) 148/74    Physical Exam  Wt Readings from Last 3 Encounters:  07/15/20 184 lb (83.5 kg)  04/12/20 191 lb (86.6 kg)  03/31/20 189 lb (85.7 kg)    BP 134/80   Pulse 72   Ht 5\' 7"  (1.702 m)   Wt 184 lb (83.5 kg)   LMP 07/10/2020 (Exact Date)   BMI 28.82 kg/m   Assessment and Plan:  1. Anemia due to chronic blood loss Chronic.  Controlled.  Stable.  Pending CBC will dictate an continuance of iron supplementation. - CBC  2. Menorrhagia with regular cycle Chronic.  To be controlled.  Stable.  Patient is followed by Dr. Glennon Mac for menorrhagia with  regular.  With the likely possibility of hysterectomy this would solve the output problem of her anemia. - CBC  3. Iron deficiency Chronic.  Controlled.  Stable.  Serum ferritin is low as well as indices indicate iron deficiency as well.  Supplementation with iron should be sufficient unless there is an absorption problem which may be as below - CBC  4. Pica Upon questioning patient has a pica problem with chewing ice and this may be contributing to the intake portion of the hemoglobin production enhance formation of red cells.

## 2020-07-16 LAB — CBC
Hematocrit: 28.5 % — ABNORMAL LOW (ref 34.0–46.6)
Hemoglobin: 8 g/dL — ABNORMAL LOW (ref 11.1–15.9)
MCH: 17.5 pg — ABNORMAL LOW (ref 26.6–33.0)
MCHC: 28.1 g/dL — ABNORMAL LOW (ref 31.5–35.7)
MCV: 63 fL — ABNORMAL LOW (ref 79–97)
Platelets: 386 10*3/uL (ref 150–450)
RBC: 4.56 x10E6/uL (ref 3.77–5.28)
RDW: 20.2 % — ABNORMAL HIGH (ref 11.7–15.4)
WBC: 6.7 10*3/uL (ref 3.4–10.8)

## 2020-08-06 ENCOUNTER — Telehealth: Payer: Self-pay

## 2020-08-06 NOTE — Telephone Encounter (Signed)
I returned patient's call. She stated that she is ready to move forward with the TLH/BS that Glennon Mac discussed with her in March. I offered som available dates that he has coming up and she would like to schedule her TLH/BS on 09/30/20 w Glennon Mac. I advised that I would let him know so that he can then request her surgery. I told her that once I have the request I will call her to go over the details and scheduling of pre-op appts.

## 2020-08-11 NOTE — Telephone Encounter (Signed)
-----   Message from Will Bonnet, MD sent at 08/06/2020  6:23 PM EDT ----- Regarding: Schedule surgery Surgery Booking Request Patient Full Name:  Ashley Silva  MRN: [id number]  DOB: 1975/12/16  Surgeon: Prentice Docker, MD  Requested Surgery Date and Time: TBD Primary Diagnosis AND Code:  1) Menorrhagia with irregular cycle [N92.0] 2) anemia due to chronic blood loss [D50.0] 3) Adenomyosis [N80.0] 4) Endometrial polyp [N84.0] Secondary Diagnosis and Code:  Surgical Procedure: Robot assisted TLH/BS, cysto RNFA Requested?: Yes L&D Notification: No Admission Status: same day surgery Length of Surgery: 125 min Special Case Needs: yes. [employer] Data processing manager H&P: Yes Phone Interview???:  Yes Interpreter: No Medical Clearance:  No Special Scheduling Instructions: just need the RNFA for uterine manipulation Any known health/anesthesia issues, diabetes, sleep apnea, latex allergy, defibrillator/pacemaker?: No Acuity: P3   (P1 highest, P2 delay may cause harm, P3 low, elective gyn, P4 lowest) Post op follow up visits: 1 week postop for incision check

## 2020-08-11 NOTE — Telephone Encounter (Signed)
Called patient to schedule Xi  total laparoscopic hysterectomy bilateral salpingectomy, cystoscopy w Kendra Opitz 9/1  H&P 8/26 @ 9:10   Post op 9/8 @ 9:10  Pre-admit phone call appointment 8/22 at 1-5pm. All appointments will be updated on pt MyChart. Explained that this appointment has a call window.   Advised that pt may also receive calls from the hospital pharmacy and pre-service center.  Confirmed pt has [employer] as Chartered certified accountant. No secondary insurance.

## 2020-09-20 ENCOUNTER — Encounter
Admission: RE | Admit: 2020-09-20 | Discharge: 2020-09-20 | Disposition: A | Discharge status: Home / Self Care | Payer: BC Managed Care – PPO | Source: Ambulatory Visit | Attending: Obstetrics and Gynecology | Admitting: Obstetrics and Gynecology

## 2020-09-20 ENCOUNTER — Other Ambulatory Visit: Payer: Self-pay

## 2020-09-20 NOTE — Patient Instructions (Addendum)
Your procedure is scheduled on: 09/30/2020 Report to the Registration Desk on the 1st floor of the [facility]. To find out your arrival time, please call [phone] between 1PM - 3PM on: 09/29/2020  REMEMBER: Instructions that are not followed completely may result in serious medical risk, up to and including death; or upon the discretion of your surgeon and anesthesiologist your surgery may need to be rescheduled.  Do not eat food after midnight the night before surgery.  No gum chewing, lozengers or hard candies.  You may however, drink CLEAR liquids up to 2 hours before you are scheduled to arrive for your surgery. Do not drink anything within 2 hours of your scheduled arrival time.  Clear liquids include: - water  - apple juice without pulp - gatorade  - black coffee or tea (Do NOT add milk or creamers to the coffee or tea) Do NOT drink anything that is not on this list.   In addition, your doctor has ordered for you to drink the provided  Ensure Pre-Surgery Clear Carbohydrate Drink  Drinking this carbohydrate drink up to two hours before surgery helps to reduce insulin resistance and improve patient outcomes. Please complete drinking 2 hours prior to scheduled arrival time.   One week prior to surgery: Stop Anti-inflammatories (NSAIDS) such as Advil, Aleve, Ibuprofen, Motrin, Naproxen, Naprosyn and Aspirin based products such as Excedrin, Goodys Powder, BC Powder. You may however, continue to take Tylenol if needed for pain up until the day of surgery. Stop ANY OVER THE COUNTER vitamins and supplements until after surgery.   No Alcohol for 24 hours before or after surgery.  No Smoking including e-cigarettes for 24 hours prior to surgery.  No chewable tobacco products for at least 6 hours prior to surgery.  No nicotine patches on the day of surgery.  Do not use any "recreational" drugs for at least a week prior to your surgery.  Please be advised that the combination of  cocaine and anesthesia may have negative outcomes, up to and including death. If you test positive for cocaine, your surgery will be cancelled.  On the morning of surgery brush your teeth with toothpaste and water, you may rinse your mouth with mouthwash if you wish. Do not swallow any toothpaste or mouthwash.  Do not wear jewelry, make-up, hairpins, clips or nail polish.  Do not wear lotions, powders, or perfumes.   Do not shave body from the neck down 48 hours prior to surgery just in case you cut yourself which could leave a site for infection.   Do not bring valuables to the hospital. [facility] is not responsible for any missing/lost belongings or valuables.   Notify your doctor if there is any change in your medical condition (cold, fever, infection).  Wear comfortable clothing (specific to your surgery type) to the hospital.  If you are being admitted to the hospital overnight, leave your suitcase in the car. After surgery it may be brought to your room.  If you are being discharged the day of surgery, you will not be allowed to drive home. You will need a responsible adult (18 years or older) to drive you home and stay with you that night.   If you are taking public transportation, you will need to have a responsible adult (18 years or older) with you. Please confirm with your physician that it is acceptable to use public transportation.   Please call the [facility] at [phone] if you have any  questions about these instructions.  Surgery Visitation Policy:  Patients undergoing a surgery or procedure may have one family member or support person with them as long as that person is not COVID-19 positive or experiencing its symptoms.  That person may remain in the waiting area during the procedure.  Inpatient Visitation:    Visiting hours are 7 a.m. to 8 p.m. Inpatients will be allowed two visitors daily. The visitors may change each day during the  patient's stay. No visitors under the age of 21. Any visitor under the age of 63 must be accompanied by an adult. The visitor must pass COVID-19 screenings, use hand sanitizer when entering and exiting the patient's room and wear a mask at all times, including in the patient's room. Patients must also wear a mask when staff or their visitor are in the room. Masking is required regardless of vaccination status.

## 2020-09-24 ENCOUNTER — Encounter: Payer: Self-pay | Admitting: Obstetrics and Gynecology

## 2020-09-24 ENCOUNTER — Other Ambulatory Visit: Payer: BC Managed Care – PPO

## 2020-09-24 ENCOUNTER — Ambulatory Visit (INDEPENDENT_AMBULATORY_CARE_PROVIDER_SITE_OTHER): Payer: BC Managed Care – PPO | Admitting: Obstetrics and Gynecology

## 2020-09-24 ENCOUNTER — Encounter: Payer: Self-pay | Admitting: Urgent Care

## 2020-09-24 ENCOUNTER — Other Ambulatory Visit
Admission: RE | Admit: 2020-09-24 | Discharge: 2020-09-24 | Disposition: A | Discharge status: Home / Self Care | Payer: BC Managed Care – PPO | Source: Ambulatory Visit | Attending: Obstetrics and Gynecology | Admitting: Obstetrics and Gynecology

## 2020-09-24 ENCOUNTER — Other Ambulatory Visit: Payer: Self-pay

## 2020-09-24 VITALS — BP 148/88 | Ht 67.0 in | Wt 180.0 lb

## 2020-09-24 DIAGNOSIS — N800 Endometriosis of uterus: Secondary | ICD-10-CM

## 2020-09-24 DIAGNOSIS — Z01812 Encounter for preprocedural laboratory examination: Secondary | ICD-10-CM | POA: Diagnosis not present

## 2020-09-24 DIAGNOSIS — N920 Excessive and frequent menstruation with regular cycle: Secondary | ICD-10-CM

## 2020-09-24 DIAGNOSIS — N8003 Adenomyosis of the uterus: Secondary | ICD-10-CM

## 2020-09-24 DIAGNOSIS — N840 Polyp of corpus uteri: Secondary | ICD-10-CM

## 2020-09-24 LAB — COMPREHENSIVE METABOLIC PANEL
ALT: 11 U/L (ref 0–44)
AST: 16 U/L (ref 15–41)
Albumin: 4.8 g/dL (ref 3.5–5.0)
Alkaline Phosphatase: 44 U/L (ref 38–126)
Anion gap: 6 (ref 5–15)
BUN: 13 mg/dL (ref 6–20)
CO2: 23 mmol/L (ref 22–32)
Calcium: 9.4 mg/dL (ref 8.9–10.3)
Chloride: 109 mmol/L (ref 98–111)
Creatinine, Ser: 0.8 mg/dL (ref 0.44–1.00)
GFR, Estimated: >60 mL/min (ref >60)
Glucose, Bld: 102 mg/dL — ABNORMAL HIGH (ref 70–99)
Potassium: 3.8 mmol/L (ref 3.5–5.1)
Sodium: 138 mmol/L (ref 135–145)
Total Bilirubin: 0.7 mg/dL (ref 0.3–1.2)
Total Protein: 8.1 g/dL (ref 6.5–8.1)

## 2020-09-24 LAB — CBC
HCT: 30.2 % — ABNORMAL LOW (ref 36.0–46.0)
Hemoglobin: 8.4 g/dL — ABNORMAL LOW (ref 12.0–15.0)
MCH: 17.1 pg — ABNORMAL LOW (ref 26.0–34.0)
MCHC: 27.8 g/dL — ABNORMAL LOW (ref 30.0–36.0)
MCV: 61.6 fL — ABNORMAL LOW (ref 80.0–100.0)
Platelets: 274 10*3/uL (ref 150–400)
RBC: 4.9 MIL/uL (ref 3.87–5.11)
RDW: 20.5 % — ABNORMAL HIGH (ref 11.5–15.5)
WBC: 5.3 10*3/uL (ref 4.0–10.5)
nRBC: 0 % (ref 0.0–0.2)

## 2020-09-24 NOTE — Progress Notes (Signed)
Preoperative History and Physical  Ashley Silva is a 45 y.o. [id number] here for surgical management of menorrhagia with regular cycle, iron deficiency anemia.   No significant preoperative concerns.  History of Present Illness: She presents due to significant anemia noted on a recent CBC.  She has regular periods, coming about every 28 days, lasting 6-7 days.  She states her periods are very heavy.  Days 2-3 (sometimes day 4) are really bad and then the bleeding just tapers off.  She goes through a pad and a tampon every 1.5 hours.  She denies intermenstrual bleeding.  She uses nothing for contraception.  She is sexually active.     She has intentional weight loss of 20 lbs.  She denies new constipation.  She notes some early satiety, though she states that she has changed the way she eats.  She notes some bloating.  Since the birth of her son in 2014 her periods have been very heavy.  She has never had any treatment for this.  She does not feeling tired, sometimes jittery, faint and dizzy.     Pap smear 03/25/20: NILM, HPV negative CBC 03/25/20: hgb 7.8, hct 28.7, iron studies indicate iron deficiency She denies a history of STIs.    Ultrasound demonstrates the following findings Adnexa: no masses seen  Uterus: anteverted with endometrial stripe  9.5 mm Additional: A single, small fibroid noted in the left, posterior myometrium, appears intramural.  Echotexture of myometrium is heterogeneous around endometrium, possible adenomyosis. Possible polyp measuring 1.5 cm.   Proposed surgery: robot assisted Total Laparoscopic Hysterectomy, bilateral salpingectomy, cystoscopy  Past Medical History:  Diagnosis Date   Hypoglycemia    Past Surgical History:  Procedure Laterality Date   CESAREAN SECTION     1   OB History  Gravida Para Term Preterm AB Living  '2 2 2     2  '$ SAB IAB Ectopic Multiple Live Births               # Outcome Date GA Lbr Len/2nd Weight Sex Delivery Anes PTL Lv  2 Term       CS-Unspec     1 Term           Patient denies any other pertinent gynecologic issues.   Current Outpatient Medications on File Prior to Visit  Medication Sig Dispense Refill   ferrous sulfate 325 (65 FE) MG tablet Take 1 tablet (325 mg total) by mouth daily with breakfast. (Patient taking differently: Take 325 mg by mouth 3 (three) times a week.) 90 tablet 0   ibuprofen (ADVIL) 200 MG tablet Take 600 mg by mouth every 6 (six) hours as needed for headache or moderate pain.     Probiotic Product (PROBIOTIC MULTI-ENZYME) TABS Take 1 tablet by mouth daily.     tranexamic acid (LYSTEDA) 650 MG TABS tablet Take 2 tablets (1,300 mg total) by mouth 3 (three) times daily. Take during menses for a maximum of five days (Patient not taking: No sig reported) 30 tablet 2   [DISCONTINUED] albuterol (PROVENTIL HFA;VENTOLIN HFA) 108 (90 Base) MCG/ACT inhaler Inhale 2 puffs into the lungs every 6 (six) hours as needed for wheezing or shortness of breath. 1 Inhaler 0   No current facility-administered medications on file prior to visit.   No Known Allergies  Social History:   reports that she quit smoking about 24 years ago. Her smoking use included cigarettes. She has never used smokeless tobacco. She reports current alcohol use. She reports that  she does not use drugs.  Family History  Problem Relation Age of Onset   Hypertension Mother    Other Father        unknown medical history   Heart disease Maternal Grandmother    Stroke Maternal Grandfather    Breast cancer Neg Hx     Review of Systems: Noncontributory  PHYSICAL EXAM: Blood pressure (!) 148/88, height '5\' 7"'$  (1.702 m), weight 180 lb (81.6 kg). CONSTITUTIONAL: Well-developed, well-nourished female in no acute distress.  HENT:  Normocephalic, atraumatic, External right and left ear normal. Oropharynx is clear and moist EYES: Conjunctivae and EOM are normal. Pupils are equal, round, and reactive to light. No scleral icterus.  NECK: Normal  range of motion, supple, no masses SKIN: Skin is warm and dry. No rash noted. Not diaphoretic. No erythema. No pallor. Train[place]: Alert and oriented to person, place, and time. Normal reflexes, muscle tone coordination. No cranial nerve deficit noted. PSYCHIATRIC: Normal mood and affect. Normal behavior. Normal judgment and thought content. CARDIOVASCULAR: Normal heart rate noted, regular rhythm RESPIRATORY: Effort and breath sounds normal, no problems with respiration noted ABDOMEN: Soft, nontender, nondistended. PELVIC: Deferred MUSCULOSKELETAL: Normal range of motion. No edema and no tenderness. 2+ distal pulses.  Labs: No results found for this or any previous visit (from the past 336 hour(s)).  Imaging Studies: No results found.  Assessment:   ICD-10-CM   1. Menorrhagia with regular cycle  N92.0     2. Adenomyosis  N80.0     3. Endometrial polyp  N84.0       Plan: Patient will undergo surgical management with the above surgery.   The risks of surgery were discussed in detail with the patient including but not limited to: bleeding which may require transfusion or reoperation; infection which may require antibiotics; injury to surrounding organs which may involve bowel, bladder, ureters ; need for additional procedures including laparoscopy or laparotomy; thromboembolic phenomenon, surgical site problems and other postoperative/anesthesia complications. Likelihood of success in alleviating the patient's condition was discussed. Routine postoperative instructions will be reviewed with the patient and her family in detail after surgery.  The patient concurred with the proposed plan, giving informed written consent for the surgery.      Prentice Docker, MD 09/24/2020 9:24 AM

## 2020-09-24 NOTE — H&P (View-Only) (Signed)
Preoperative History and Physical  Ashley Silva is a 45 y.o. [id number] here for surgical management of menorrhagia with regular cycle, iron deficiency anemia.   No significant preoperative concerns.  History of Present Illness: She presents due to significant anemia noted on a recent CBC.  She has regular periods, coming about every 28 days, lasting 6-7 days.  She states her periods are very heavy.  Days 2-3 (sometimes day 4) are really bad and then the bleeding just tapers off.  She goes through a pad and a tampon every 1.5 hours.  She denies intermenstrual bleeding.  She uses nothing for contraception.  She is sexually active.     She has intentional weight loss of 20 lbs.  She denies new constipation.  She notes some early satiety, though she states that she has changed the way she eats.  She notes some bloating.  Since the birth of her son in 2014 her periods have been very heavy.  She has never had any treatment for this.  She does not feeling tired, sometimes jittery, faint and dizzy.     Pap smear 03/25/20: NILM, HPV negative CBC 03/25/20: hgb 7.8, hct 28.7, iron studies indicate iron deficiency She denies a history of STIs.    Ultrasound demonstrates the following findings Adnexa: no masses seen  Uterus: anteverted with endometrial stripe  9.5 mm Additional: A single, small fibroid noted in the left, posterior myometrium, appears intramural.  Echotexture of myometrium is heterogeneous around endometrium, possible adenomyosis. Possible polyp measuring 1.5 cm.   Proposed surgery: robot assisted Total Laparoscopic Hysterectomy, bilateral salpingectomy, cystoscopy  Past Medical History:  Diagnosis Date   Hypoglycemia    Past Surgical History:  Procedure Laterality Date   CESAREAN SECTION     1   OB History  Gravida Para Term Preterm AB Living  '2 2 2     2  '$ SAB IAB Ectopic Multiple Live Births               # Outcome Date GA Lbr Len/2nd Weight Sex Delivery Anes PTL Lv  2 Term       CS-Unspec     1 Term           Patient denies any other pertinent gynecologic issues.   Current Outpatient Medications on File Prior to Visit  Medication Sig Dispense Refill   ferrous sulfate 325 (65 FE) MG tablet Take 1 tablet (325 mg total) by mouth daily with breakfast. (Patient taking differently: Take 325 mg by mouth 3 (three) times a week.) 90 tablet 0   ibuprofen (ADVIL) 200 MG tablet Take 600 mg by mouth every 6 (six) hours as needed for headache or moderate pain.     Probiotic Product (PROBIOTIC MULTI-ENZYME) TABS Take 1 tablet by mouth daily.     tranexamic acid (LYSTEDA) 650 MG TABS tablet Take 2 tablets (1,300 mg total) by mouth 3 (three) times daily. Take during menses for a maximum of five days (Patient not taking: No sig reported) 30 tablet 2   [DISCONTINUED] albuterol (PROVENTIL HFA;VENTOLIN HFA) 108 (90 Base) MCG/ACT inhaler Inhale 2 puffs into the lungs every 6 (six) hours as needed for wheezing or shortness of breath. 1 Inhaler 0   No current facility-administered medications on file prior to visit.   No Known Allergies  Social History:   reports that she quit smoking about 24 years ago. Her smoking use included cigarettes. She has never used smokeless tobacco. She reports current alcohol use. She reports that  she does not use drugs.  Family History  Problem Relation Age of Onset   Hypertension Mother    Other Father        unknown medical history   Heart disease Maternal Grandmother    Stroke Maternal Grandfather    Breast cancer Neg Hx     Review of Systems: Noncontributory  PHYSICAL EXAM: Blood pressure (!) 148/88, height '5\' 7"'$  (1.702 m), weight 180 lb (81.6 kg). CONSTITUTIONAL: Well-developed, well-nourished female in no acute distress.  HENT:  Normocephalic, atraumatic, External right and left ear normal. Oropharynx is clear and moist EYES: Conjunctivae and EOM are normal. Pupils are equal, round, and reactive to light. No scleral icterus.  NECK: Normal  range of motion, supple, no masses SKIN: Skin is warm and dry. No rash noted. Not diaphoretic. No erythema. No pallor. [place]: Alert and oriented to person, place, and time. Normal reflexes, muscle tone coordination. No cranial nerve deficit noted. PSYCHIATRIC: Normal mood and affect. Normal behavior. Normal judgment and thought content. CARDIOVASCULAR: Normal heart rate noted, regular rhythm RESPIRATORY: Effort and breath sounds normal, no problems with respiration noted ABDOMEN: Soft, nontender, nondistended. PELVIC: Deferred MUSCULOSKELETAL: Normal range of motion. No edema and no tenderness. 2+ distal pulses.  Labs: No results found for this or any previous visit (from the past 336 hour(s)).  Imaging Studies: No results found.  Assessment:   ICD-10-CM   1. Menorrhagia with regular cycle  N92.0     2. Adenomyosis  N80.0     3. Endometrial polyp  N84.0       Plan: Patient will undergo surgical management with the above surgery.   The risks of surgery were discussed in detail with the patient including but not limited to: bleeding which may require transfusion or reoperation; infection which may require antibiotics; injury to surrounding organs which may involve bowel, bladder, ureters ; need for additional procedures including laparoscopy or laparotomy; thromboembolic phenomenon, surgical site problems and other postoperative/anesthesia complications. Likelihood of success in alleviating the patient's condition was discussed. Routine postoperative instructions will be reviewed with the patient and her family in detail after surgery.  The patient concurred with the proposed plan, giving informed written consent for the surgery.      Prentice Docker, MD 09/24/2020 9:24 AM

## 2020-09-29 MED ORDER — POVIDONE-IODINE 10 % EX SWAB
2.0000 "application " | Freq: Once | CUTANEOUS | Status: AC
  Administered 2020-09-30: 2 via TOPICAL

## 2020-09-29 MED ORDER — LACTATED RINGERS IV SOLN: INTRAVENOUS | Status: DC

## 2020-09-29 MED ORDER — ORAL CARE MOUTH RINSE: 15.0000 mL | Freq: Once | OROMUCOSAL | Status: AC

## 2020-09-29 MED ORDER — CHLORHEXIDINE GLUCONATE 0.12 % MT SOLN: 15.0000 mL | Freq: Once | OROMUCOSAL | Status: AC

## 2020-09-29 MED ORDER — FAMOTIDINE 20 MG PO TABS: 20.0000 mg | ORAL_TABLET | Freq: Once | ORAL | Status: AC

## 2020-09-29 MED ORDER — CEFAZOLIN SODIUM-DEXTROSE 2-4 GM/100ML-% IV SOLN
2.0000 g | INTRAVENOUS | Status: AC
  Administered 2020-09-30: 2 g via INTRAVENOUS

## 2020-09-30 ENCOUNTER — Ambulatory Visit: Payer: BC Managed Care – PPO | Admitting: Urgent Care

## 2020-09-30 ENCOUNTER — Encounter
Admission: RE | Disposition: A | Discharge status: Home / Self Care | Payer: Self-pay | Source: Home / Self Care | Attending: Obstetrics and Gynecology

## 2020-09-30 ENCOUNTER — Encounter: Payer: Self-pay | Admitting: Obstetrics and Gynecology

## 2020-09-30 ENCOUNTER — Ambulatory Visit
Admission: RE | Admit: 2020-09-30 | Discharge: 2020-09-30 | Disposition: A | Discharge status: Home / Self Care | Payer: BC Managed Care – PPO | Attending: Obstetrics and Gynecology | Admitting: Obstetrics and Gynecology

## 2020-09-30 ENCOUNTER — Ambulatory Visit: Payer: BC Managed Care – PPO | Admitting: Certified Registered"

## 2020-09-30 ENCOUNTER — Other Ambulatory Visit: Payer: Self-pay

## 2020-09-30 DIAGNOSIS — N800 Endometriosis of uterus: Secondary | ICD-10-CM | POA: Diagnosis not present

## 2020-09-30 DIAGNOSIS — Z90710 Acquired absence of both cervix and uterus: Secondary | ICD-10-CM

## 2020-09-30 DIAGNOSIS — N840 Polyp of corpus uteri: Secondary | ICD-10-CM | POA: Diagnosis present

## 2020-09-30 DIAGNOSIS — N8003 Adenomyosis of the uterus: Secondary | ICD-10-CM | POA: Diagnosis present

## 2020-09-30 DIAGNOSIS — D251 Intramural leiomyoma of uterus: Secondary | ICD-10-CM | POA: Diagnosis not present

## 2020-09-30 DIAGNOSIS — D500 Iron deficiency anemia secondary to blood loss (chronic): Secondary | ICD-10-CM | POA: Diagnosis present

## 2020-09-30 DIAGNOSIS — N920 Excessive and frequent menstruation with regular cycle: Secondary | ICD-10-CM | POA: Diagnosis not present

## 2020-09-30 DIAGNOSIS — Z87891 Personal history of nicotine dependence: Secondary | ICD-10-CM | POA: Insufficient documentation

## 2020-09-30 HISTORY — PX: ROBOTIC ASSISTED LAPAROSCOPIC HYSTERECTOMY AND SALPINGECTOMY: SHX6379

## 2020-09-30 HISTORY — PX: CYSTOSCOPY: SHX5120

## 2020-09-30 LAB — POCT PREGNANCY, URINE: Preg Test, Ur: NEGATIVE

## 2020-09-30 LAB — ABO/RH: ABO/RH(D): O POS

## 2020-09-30 SURGERY — XI ROBOTIC ASSISTED LAPAROSCOPIC HYSTERECTOMY AND SALPINGECTOMY
Anesthesia: General

## 2020-09-30 MED ORDER — PROMETHAZINE HCL 25 MG/ML IJ SOLN
INTRAMUSCULAR | Status: AC
  Administered 2020-09-30: 6.25 mg
  Filled 2020-09-30: qty 1

## 2020-09-30 MED ORDER — FENTANYL CITRATE (PF) 100 MCG/2ML IJ SOLN
25.0000 ug | INTRAMUSCULAR | Status: DC | PRN
  Administered 2020-09-30 (×4): 25 ug via INTRAVENOUS

## 2020-09-30 MED ORDER — BUPIVACAINE-EPINEPHRINE (PF) 0.5% -1:200000 IJ SOLN
INTRAMUSCULAR | Status: AC
  Filled 2020-09-30: qty 30

## 2020-09-30 MED ORDER — DEXAMETHASONE SODIUM PHOSPHATE 10 MG/ML IJ SOLN
INTRAMUSCULAR | Status: AC
  Filled 2020-09-30: qty 1

## 2020-09-30 MED ORDER — FAMOTIDINE 20 MG PO TABS
ORAL_TABLET | ORAL | Status: AC
  Administered 2020-09-30: 20 mg via ORAL
  Filled 2020-09-30: qty 1

## 2020-09-30 MED ORDER — OXYCODONE HCL 5 MG PO TABS: 5.0000 mg | ORAL_TABLET | Freq: Once | ORAL | Status: DC | PRN

## 2020-09-30 MED ORDER — 0.9 % SODIUM CHLORIDE (POUR BTL) OPTIME
TOPICAL | Status: DC | PRN
  Administered 2020-09-30: 200 mL

## 2020-09-30 MED ORDER — IBUPROFEN 600 MG PO TABS: 600.0000 mg | ORAL_TABLET | Freq: Four times a day (QID) | ORAL | 0 refills | Status: DC

## 2020-09-30 MED ORDER — PROPOFOL 10 MG/ML IV BOLUS
INTRAVENOUS | Status: DC | PRN
  Administered 2020-09-30: 200 mg via INTRAVENOUS

## 2020-09-30 MED ORDER — KETOROLAC TROMETHAMINE 30 MG/ML IJ SOLN
INTRAMUSCULAR | Status: DC | PRN
  Administered 2020-09-30: 30 mg via INTRAVENOUS

## 2020-09-30 MED ORDER — SODIUM CHLORIDE (PF) 0.9 % IJ SOLN
INTRAMUSCULAR | Status: AC
  Filled 2020-09-30: qty 10

## 2020-09-30 MED ORDER — ONDANSETRON HCL 4 MG/2ML IJ SOLN
INTRAMUSCULAR | Status: AC
  Filled 2020-09-30: qty 2

## 2020-09-30 MED ORDER — BUPIVACAINE HCL 0.5 % IJ SOLN
INTRAMUSCULAR | Status: DC | PRN
  Administered 2020-09-30: 10 mL

## 2020-09-30 MED ORDER — PHENYLEPHRINE HCL (PRESSORS) 10 MG/ML IV SOLN
INTRAVENOUS | Status: DC | PRN
  Administered 2020-09-30 (×4): 100 ug via INTRAVENOUS

## 2020-09-30 MED ORDER — CEFAZOLIN SODIUM-DEXTROSE 2-4 GM/100ML-% IV SOLN
INTRAVENOUS | Status: AC
  Filled 2020-09-30: qty 100

## 2020-09-30 MED ORDER — OXYCODONE HCL 5 MG/5ML PO SOLN
5.0000 mg | Freq: Once | ORAL | Status: DC | PRN
Start: 2020-09-30 — End: 2020-09-30

## 2020-09-30 MED ORDER — FENTANYL CITRATE (PF) 100 MCG/2ML IJ SOLN
INTRAMUSCULAR | Status: AC
  Filled 2020-09-30: qty 2

## 2020-09-30 MED ORDER — SODIUM CHLORIDE 0.9 % IR SOLN
Status: DC | PRN
  Administered 2020-09-30 (×2): 100 mL

## 2020-09-30 MED ORDER — FENTANYL CITRATE (PF) 100 MCG/2ML IJ SOLN
INTRAMUSCULAR | Status: DC | PRN
  Administered 2020-09-30 (×2): 50 ug via INTRAVENOUS

## 2020-09-30 MED ORDER — KETOROLAC TROMETHAMINE 30 MG/ML IJ SOLN
INTRAMUSCULAR | Status: AC
  Filled 2020-09-30: qty 1

## 2020-09-30 MED ORDER — LACTATED RINGERS IV SOLN: INTRAVENOUS | Status: DC

## 2020-09-30 MED ORDER — HEMOSTATIC AGENTS (NO CHARGE) OPTIME
TOPICAL | Status: DC | PRN
  Administered 2020-09-30: 1 via TOPICAL

## 2020-09-30 MED ORDER — SUGAMMADEX SODIUM 200 MG/2ML IV SOLN
INTRAVENOUS | Status: DC | PRN
  Administered 2020-09-30: 200 mg via INTRAVENOUS

## 2020-09-30 MED ORDER — DEXAMETHASONE SODIUM PHOSPHATE 10 MG/ML IJ SOLN
INTRAMUSCULAR | Status: DC | PRN
  Administered 2020-09-30: 10 mg via INTRAVENOUS

## 2020-09-30 MED ORDER — LIDOCAINE HCL (CARDIAC) PF 100 MG/5ML IV SOSY
PREFILLED_SYRINGE | INTRAVENOUS | Status: DC | PRN
  Administered 2020-09-30: 50 mg via INTRAVENOUS

## 2020-09-30 MED ORDER — ONDANSETRON HCL 4 MG/2ML IJ SOLN
INTRAMUSCULAR | Status: DC | PRN
  Administered 2020-09-30: 4 mg via INTRAVENOUS

## 2020-09-30 MED ORDER — GLYCOPYRROLATE 0.2 MG/ML IJ SOLN
INTRAMUSCULAR | Status: DC | PRN
  Administered 2020-09-30: .2 mg via INTRAVENOUS

## 2020-09-30 MED ORDER — OXYCODONE-ACETAMINOPHEN 5-325 MG PO TABS: 1.0000 | ORAL_TABLET | Freq: Four times a day (QID) | ORAL | 0 refills | Status: AC | PRN

## 2020-09-30 MED ORDER — ACETAMINOPHEN 10 MG/ML IV SOLN
INTRAVENOUS | Status: DC | PRN
  Administered 2020-09-30: 1000 mg via INTRAVENOUS

## 2020-09-30 MED ORDER — ROCURONIUM BROMIDE 10 MG/ML (PF) SYRINGE
PREFILLED_SYRINGE | INTRAVENOUS | Status: AC
  Filled 2020-09-30: qty 10

## 2020-09-30 MED ORDER — MIDAZOLAM HCL 2 MG/2ML IJ SOLN
INTRAMUSCULAR | Status: AC
  Filled 2020-09-30: qty 2

## 2020-09-30 MED ORDER — BUPIVACAINE HCL (PF) 0.5 % IJ SOLN
INTRAMUSCULAR | Status: AC
  Filled 2020-09-30: qty 30

## 2020-09-30 MED ORDER — MIDAZOLAM HCL 2 MG/2ML IJ SOLN
INTRAMUSCULAR | Status: DC | PRN
  Administered 2020-09-30: 2 mg via INTRAVENOUS

## 2020-09-30 MED ORDER — ROCURONIUM BROMIDE 100 MG/10ML IV SOLN
INTRAVENOUS | Status: DC | PRN
Start: 2020-09-30 — End: 2020-09-30
  Administered 2020-09-30 (×2): 20 mg via INTRAVENOUS
  Administered 2020-09-30: 60 mg via INTRAVENOUS

## 2020-09-30 MED ORDER — SODIUM CHLORIDE 0.9 % IV SOLN
6.2500 mg | Freq: Once | INTRAVENOUS | Status: DC
  Filled 2020-09-30: qty 0.25

## 2020-09-30 MED ORDER — ACETAMINOPHEN 10 MG/ML IV SOLN
INTRAVENOUS | Status: AC
  Filled 2020-09-30: qty 100

## 2020-09-30 MED ORDER — PROPOFOL 10 MG/ML IV BOLUS
INTRAVENOUS | Status: AC
  Filled 2020-09-30: qty 20

## 2020-09-30 MED ORDER — DEXMEDETOMIDINE (PRECEDEX) IN NS 20 MCG/5ML (4 MCG/ML) IV SYRINGE
PREFILLED_SYRINGE | INTRAVENOUS | Status: DC | PRN
  Administered 2020-09-30 (×2): 8 ug via INTRAVENOUS

## 2020-09-30 MED ORDER — LIDOCAINE HCL (PF) 2 % IJ SOLN
INTRAMUSCULAR | Status: AC
  Filled 2020-09-30: qty 5

## 2020-09-30 MED ORDER — CHLORHEXIDINE GLUCONATE 0.12 % MT SOLN
OROMUCOSAL | Status: AC
  Administered 2020-09-30: 15 mL via OROMUCOSAL
  Filled 2020-09-30: qty 15

## 2020-09-30 MED ORDER — ONDANSETRON 4 MG PO TBDP: 4.0000 mg | ORAL_TABLET | Freq: Three times a day (TID) | ORAL | 0 refills | Status: DC | PRN

## 2020-09-30 MED ORDER — GLYCOPYRROLATE 0.2 MG/ML IJ SOLN
INTRAMUSCULAR | Status: AC
  Filled 2020-09-30: qty 1

## 2020-09-30 MED ORDER — SEVOFLURANE IN SOLN
RESPIRATORY_TRACT | Status: AC
  Filled 2020-09-30: qty 250

## 2020-09-30 SURGICAL SUPPLY — 70 items
ADH SKN CLS APL DERMABOND .7 (GAUZE/BANDAGES/DRESSINGS) ×2
APL PRP STRL LF DISP 70% ISPRP (MISCELLANEOUS) ×2
APL SRG 38 LTWT LNG FL B (MISCELLANEOUS) ×2
APPLICATOR ARISTA FLEXITIP XL (MISCELLANEOUS) ×3 IMPLANT
BAG DRN RND TRDRP ANRFLXCHMBR (UROLOGICAL SUPPLIES) ×2
BAG URINE DRAIN 2000ML AR STRL (UROLOGICAL SUPPLIES) ×3 IMPLANT
BLADE SURG SZ11 CARB STEEL (BLADE) ×3 IMPLANT
CANNULA CAP OBTURATR AIRSEAL 8 (CAP) ×3 IMPLANT
CATH FOLEY 2WAY  5CC 16FR (CATHETERS) ×1
CATH FOLEY 2WAY 5CC 16FR (CATHETERS) ×2
CATH URTH 16FR FL 2W BLN LF (CATHETERS) ×2 IMPLANT
CHLORAPREP W/TINT 26 (MISCELLANEOUS) ×3 IMPLANT
COVER MAYO STAND REUSABLE (DRAPES) ×3 IMPLANT
COVER TIP SHEARS 8 DVNC (MISCELLANEOUS) ×2 IMPLANT
COVER TIP SHEARS 8MM DA VINCI (MISCELLANEOUS) ×1
DEFOGGER SCOPE WARMER CLEARIFY (MISCELLANEOUS) ×3 IMPLANT
DERMABOND ADVANCED (GAUZE/BANDAGES/DRESSINGS) ×1
DERMABOND ADVANCED .7 DNX12 (GAUZE/BANDAGES/DRESSINGS) ×2 IMPLANT
DRAPE 3/4 80X56 (DRAPES) ×3 IMPLANT
DRAPE ARM DVNC X/XI (DISPOSABLE) ×8 IMPLANT
DRAPE DA VINCI XI ARM (DISPOSABLE) ×4
DRAPE LEGGINS SURG 28X43 STRL (DRAPES) ×3 IMPLANT
DRAPE ROBOT W/ LEGGING 30X125 (DRAPES) ×3 IMPLANT
DRAPE UNDER BUTTOCK W/FLU (DRAPES) ×3 IMPLANT
ELECT REM PT RETURN 9FT ADLT (ELECTROSURGICAL) ×3
ELECTRODE REM PT RTRN 9FT ADLT (ELECTROSURGICAL) ×2 IMPLANT
GAUZE 4X4 16PLY ~~LOC~~+RFID DBL (SPONGE) ×6 IMPLANT
GLOVE SURG ENC MOIS LTX SZ7 (GLOVE) ×18 IMPLANT
GLOVE SURG UNDER POLY LF SZ7.5 (GLOVE) ×6 IMPLANT
GOWN STRL REUS W/ TWL LRG LVL3 (GOWN DISPOSABLE) ×12 IMPLANT
GOWN STRL REUS W/ TWL XL LVL3 (GOWN DISPOSABLE) ×2 IMPLANT
GOWN STRL REUS W/TWL LRG LVL3 (GOWN DISPOSABLE) ×18
GOWN STRL REUS W/TWL XL LVL3 (GOWN DISPOSABLE) ×3
HEMOSTAT ARISTA ABSORB 1G (HEMOSTASIS) ×3 IMPLANT
IRRIGATION STRYKERFLOW (MISCELLANEOUS) ×2 IMPLANT
IRRIGATOR STRYKERFLOW (MISCELLANEOUS) ×3
IV NS 1000ML (IV SOLUTION) ×6
IV NS 1000ML BAXH (IV SOLUTION) ×4 IMPLANT
KIT PINK PAD W/HEAD ARE REST (MISCELLANEOUS) ×3
KIT PINK PAD W/HEAD ARM REST (MISCELLANEOUS) ×2 IMPLANT
KIT TURNOVER CYSTO (KITS) ×3 IMPLANT
LABEL OR SOLS (LABEL) ×3 IMPLANT
MANIFOLD NEPTUNE II (INSTRUMENTS) ×3 IMPLANT
MANIPULATOR VCARE LG CRV RETR (MISCELLANEOUS) ×3 IMPLANT
NEEDLE HYPO 22GX1.5 SAFETY (NEEDLE) ×3 IMPLANT
NS IRRIG 500ML POUR BTL (IV SOLUTION) ×3 IMPLANT
OBTURATOR OPTICAL STANDARD 8MM (TROCAR) ×1
OBTURATOR OPTICAL STND 8 DVNC (TROCAR) ×2
OBTURATOR OPTICALSTD 8 DVNC (TROCAR) ×2 IMPLANT
OCCLUDER COLPOPNEUMO (BALLOONS) ×3 IMPLANT
PACK LAP CHOLECYSTECTOMY (MISCELLANEOUS) ×3 IMPLANT
PAD OB MATERNITY 4.3X12.25 (PERSONAL CARE ITEMS) ×3 IMPLANT
PAD PREP 24X41 OB/GYN DISP (PERSONAL CARE ITEMS) ×3 IMPLANT
SCRUB EXIDINE 4% CHG 4OZ (MISCELLANEOUS) ×3 IMPLANT
SEAL CANN UNIV 5-8 DVNC XI (MISCELLANEOUS) ×8 IMPLANT
SEAL XI 5MM-8MM UNIVERSAL (MISCELLANEOUS) ×4
SEALER VESSEL DA VINCI XI (MISCELLANEOUS) ×1
SEALER VESSEL EXT DVNC XI (MISCELLANEOUS) ×2 IMPLANT
SET CYSTO W/LG BORE CLAMP LF (SET/KITS/TRAYS/PACK) ×3 IMPLANT
SET TUBE FILTERED XL AIRSEAL (SET/KITS/TRAYS/PACK) ×3 IMPLANT
SOLUTION ELECTROLUBE (MISCELLANEOUS) ×3 IMPLANT
SURGILUBE 2OZ TUBE FLIPTOP (MISCELLANEOUS) ×3 IMPLANT
SUT MNCRL 4-0 (SUTURE) ×3
SUT MNCRL 4-0 27XMFL (SUTURE) ×2
SUT STRATAFIX 0 PDS+ CT-2 23 (SUTURE) ×3
SUTURE MNCRL 4-0 27XMF (SUTURE) ×2 IMPLANT
SUTURE STRATFX 0 PDS+ CT-2 23 (SUTURE) ×2 IMPLANT
SYR 10ML LL (SYRINGE) ×3 IMPLANT
SYR 50ML LL SCALE MARK (SYRINGE) ×3 IMPLANT
WATER STERILE IRR 500ML POUR (IV SOLUTION) ×3 IMPLANT

## 2020-09-30 NOTE — Anesthesia Preprocedure Evaluation (Signed)
Anesthesia Evaluation  Patient identified by MRN, date of birth, ID band Patient awake    Reviewed: Allergy & Precautions, NPO status , Patient's Chart, lab work & pertinent test results  History of Anesthesia Complications Negative for: history of anesthetic complications  Airway Mallampati: III  TM Distance: >3 FB Neck ROM: full    Dental  (+) Chipped   Pulmonary neg shortness of breath, former smoker,    Pulmonary exam normal        Cardiovascular (-) angina(-) Past MI and (-) DOE negative cardio ROS Normal cardiovascular exam     Neuro/Psych negative neurological ROS  negative psych ROS   GI/Hepatic negative GI ROS, Neg liver ROS, neg GERD  ,  Endo/Other  negative endocrine ROS  Renal/GU      Musculoskeletal   Abdominal   Peds  Hematology negative hematology ROS (+)   Anesthesia Other Findings Past Medical History: No date: Hypoglycemia  Past Surgical History: No date: CESAREAN SECTION     Comment:  1  BMI    Body Mass Index: 28.19 kg/m      Reproductive/Obstetrics negative OB ROS                             Anesthesia Physical Anesthesia Plan  ASA: 2  Anesthesia Plan: General ETT   Post-op Pain Management:    Induction: Intravenous  PONV Risk Score and Plan: Ondansetron, Dexamethasone, Midazolam and Treatment may vary due to age or medical condition  Airway Management Planned: Oral ETT  Additional Equipment:   Intra-op Plan:   Post-operative Plan: Extubation in OR  Informed Consent: I have reviewed the patients History and Physical, chart, labs and discussed the procedure including the risks, benefits and alternatives for the proposed anesthesia with the patient or authorized representative who has indicated his/her understanding and acceptance.     Dental Advisory Given  Plan Discussed with: Anesthesiologist, CRNA and Surgeon  Anesthesia Plan Comments:  (Patient consented for risks of anesthesia including but not limited to:  - adverse reactions to medications - damage to eyes, teeth, lips or other oral mucosa - nerve damage due to positioning  - sore throat or hoarseness - Damage to heart, brain, nerves, lungs, other parts of body or loss of life  Patient voiced understanding.)        Anesthesia Quick Evaluation

## 2020-09-30 NOTE — Anesthesia Postprocedure Evaluation (Signed)
Anesthesia Post Note  Patient: Ashley Silva  Procedure(s) Performed: XI ROBOTIC ASSISTED LAPAROSCOPIC HYSTERECTOMY AND SALPINGECTOMY (Bilateral) CYSTOSCOPY  Patient location during evaluation: PACU Anesthesia Type: General Level of consciousness: awake and alert Pain management: pain level controlled Vital Signs Assessment: post-procedure vital signs reviewed and stable Respiratory status: spontaneous breathing, nonlabored ventilation, respiratory function stable and patient connected to nasal cannula oxygen Cardiovascular status: blood pressure returned to baseline and stable Postop Assessment: no apparent nausea or vomiting Anesthetic complications: no   No notable events documented.   Last Vitals:  Vitals:   09/30/20 1152 09/30/20 1400  BP: 140/79 140/89  Pulse: 88 99  Resp: 14 15  Temp: 36.6 C   SpO2: 100% 100%    Last Pain:  Vitals:   09/30/20 1400  TempSrc:   PainSc: 1                  Precious Haws Anaja Monts

## 2020-09-30 NOTE — Interval H&P Note (Signed)
History and Physical Interval Note:  09/30/2020 7:07 AM  Ashley Silva  has presented today for surgery, with the diagnosis of Menorrhagia with irregular cycle N92.0 anemia due to chronic blood loss D50.0 Adenomyosis N80.0 Endometrial polyp N84.0.  The various methods of treatment have been discussed with the patient and family. After consideration of risks, benefits and other options for treatment, the patient has consented to  Procedure(s): XI ROBOTIC ASSISTED LAPAROSCOPIC HYSTERECTOMY AND SALPINGECTOMY (Bilateral) CYSTOSCOPY (N/A) as a surgical intervention.  The patient's history has been reviewed, patient examined, no change in status, stable for surgery.  I have reviewed the patient's chart and labs.  Questions were answered to the patient's satisfaction.  Consents reviewed. Patient agrees to proceed.   One units pRBCs typed and held for surgery given low hemoglobin preoperatively, though low blood loss is typical with this type of surgery.    Prentice Docker, MD, [facility], [facility] 09/30/2020 7:08 AM

## 2020-09-30 NOTE — Op Note (Addendum)
Operative Note    Name: Ashley Silva  Date of Service: 09/30/2020  DOB: 1975/05/09  MRN: [id number]   Pre-Operative Diagnosis:  1) Menorrhagia with regular cycle [N92.0] 2) Adenomyosis [N80.0] 3) Endometrial polyp [N84.0] 4) Chronic Blood loss anemia [D50.0]  Post-Operative Diagnosis:  1) Menorrhagia with regular cycle [N92.0] 2) Adenomyosis [N80.0] 3) Endometrial polyp [N84.0] 4) Chronic Blood loss anemia [D50.0]  Procedures:  1) Robot assisted Total Laparoscopic Hysterectomy, bilateral salpingectomy  2) Cystoscopy  Primary Surgeon: Prentice Docker, MD   EBL: 30 mL   IVF: 600 mL   Urine output: 550 mL clear urine at end of procedure  Specimens: uterus with cervix and bilateral fallopian tubes  Drains: none  Complications: None   Disposition: PACU   Condition: Stable   Findings:  1) Globally enlarged uterus with normal appearing fallopian tubes and ovaries 2) on cystoscopy, no evidence of bladder wall injury with noted efflux of urine from the bilateral ureteral orifices  Procedure Summary:  The patient was taken to the operating room where general anesthesia was administered and found to be adequate. She was placed in the dorsal supine lithotomy position in [place] stirrups and prepped and draped in usual sterile fashion. After a timeout was called an indwelling catheter was placed in her bladder.  A sterile speculum was placed in her vagina.  The anterior lip of the cervix was grasped with the single-tooth tenaculum.  The cervix was serially dilated to an 11 Pratt dilator.  The large Vcare device was placed in accordance to the manufacturer's recommendations.  The speculum was removed.   Attention was turned to the abdomen where after injection of local anesthetic, an 8 mm infraumbilical incision was made with the scalpel. Entry into the abdomen was obtained via Optiview trocar technique (a blunt entry technique with camera visualization through the obturator upon  entry). Verification of entry into the abdomen was obtained using opening pressures. The abdomen was insufflated with CO2. The camera was introduced through the trocar with verification of atraumatic entry.  Right and left abdominal entry sites were created after injection of local anesthetic about 8 cm lateral to the umbilical port in accordance with the Intuitive manufacturer's recommendations.  An additional port was placed 8 cm lateral to the right abdominal port with verification of clearance above the iliac crest by more than 2 cm.  The port sites were 8 mm.  The intuitive trochars were introduced under intra-abdominal camera visualization without difficulty   The XI robot was docked on the patient's left.  Clearance was verified from the patient's legs.  Through the umbilical port the camera was placed.  Through the port attached to arm 3 the monopolar scissors were placed.  Through the port attached to arm 4 the forced bipolar forceps were was placed.  The vessel sealer was attached to port 1.   After inspection of the abdomen and pelvis with the above-noted findings, the bilateral ureters were identified and found to be well away from the operative area of interest. The right fallopian tube was grasped at the fimbriated end and was transected using the vessel sealer along the mesosalpinx in a lateral to medial fashion. The vessel sealer was used to transect the right round ligament and the utero-ovarian ligament was transected. Tissue was divided along the right broad ligament to the level of the interior cervical os. Scar tissue and bladder tissue were dissected off the lower uterine segment and cervix without difficulty. The right uterine artery was skeletonized and identified  and after ligation was transected with the Vessel Sealer device. The same procedure was carried out on the left side. The colpotomy was performed using monopolar electrocautery in a circumferential fashion following the KOH  ring.  The uterus and fallopian tubes and cervix were removed through the vagina.   Closure of the vaginal cuff was undertaken using the Stratafix stitch in a running fashion. All vascular pedicles were inspected and found to be hemostatic.  The pressure in the abdomen was lowered to 5 mmHg and the vascular pedicles remained hemostatic. To ensure ongoing hemostasis Arista 1 grams was placed along the vaginal cuff closure.  All instruments removed from the robotic ports.  The robot was undocked from the patient.  The abdomen was then desufflated of CO2.  All trochars were then removed.  All skin incisions were closed using 4-0 Vicryl in a subcuticular fashion and reinforced using surgical skin glue.   Cystoscopy was undertaken at this point. The Foley catheter was removed and the 30 cystoscope was gently introduced through the urethra. The bladder survey was undertaken with efflux of urine from both orifices noted. There were no defects noted in the bladder wall. The cystoscope was removed and the Foley catheter was utilized to fully empty the bladder. The catheter was removed.  The patient tolerated the procedure well.  Sponge, lap, needle, and instrument counts were correct x 2.  VTE prophylaxis: SCDs. Antibiotic prophylaxis: Ancef 2 grams IV. She was awakened in the operating room and was taken to the PACU in stable condition.   Prentice Docker, MD 09/30/2020 10:39 AM

## 2020-09-30 NOTE — Transfer of Care (Signed)
Immediate Anesthesia Transfer of Care Note  Patient: Ashley Silva  Procedure(s) Performed: XI ROBOTIC ASSISTED LAPAROSCOPIC HYSTERECTOMY AND SALPINGECTOMY (Bilateral) CYSTOSCOPY  Patient Location: PACU  Anesthesia Type:General  Level of Consciousness: awake and drowsy  Airway & Oxygen Therapy: Patient Spontanous Breathing and Patient connected to face mask oxygen  Post-op Assessment: Report given to RN and Post -op Vital signs reviewed and stable  Post vital signs: Reviewed  Last Vitals:  Vitals Value Taken Time  BP    Temp    Pulse    Resp 11 09/30/20 1051  SpO2    Vitals shown include unvalidated device data.  Last Pain:  Vitals:   09/30/20 0623  TempSrc: Temporal  PainSc: 0-No pain         Complications: No notable events documented.

## 2020-09-30 NOTE — Discharge Instructions (Signed)
AMBULATORY SURGERY  ?DISCHARGE INSTRUCTIONS ? ? ?The drugs that you were given will stay in your system until tomorrow so for the next 24 hours you should not: ? ?Drive an automobile ?Make any legal decisions ?Drink any alcoholic beverage ? ? ?You may resume regular meals tomorrow.  Today it is better to start with liquids and gradually work up to solid foods. ? ?You may eat anything you prefer, but it is better to start with liquids, then soup and crackers, and gradually work up to solid foods. ? ? ?Please notify your doctor immediately if you have any unusual bleeding, trouble breathing, redness and pain at the surgery site, drainage, fever, or pain not relieved by medication. ? ? ? ?Additional Instructions: ? ? ? ?Please contact your physician with any problems or Same Day Surgery at [phone], Monday through Friday 6 am to 4 pm, or [place] [facility] [place] number at [phone].  ?

## 2020-09-30 NOTE — Anesthesia Procedure Notes (Signed)
Procedure Name: Intubation Date/Time: 09/30/2020 7:54 AM Performed by: Rolla Plate, CRNA Pre-anesthesia Checklist: Patient identified, Patient being monitored, Timeout performed, Emergency Drugs available and Suction available Patient Re-evaluated:Patient Re-evaluated prior to induction Oxygen Delivery Method: Circle system utilized Preoxygenation: Pre-oxygenation with 100% oxygen Induction Type: IV induction Ventilation: Mask ventilation without difficulty Laryngoscope Size: Mac and 3 Grade View: Grade I Tube type: Oral Tube size: 7.0 mm Number of attempts: 1 Airway Equipment and Method: Stylet and Video-laryngoscopy Placement Confirmation: ETT inserted through vocal cords under direct vision, positive ETCO2 and breath sounds checked- equal and bilateral Secured at: 21 cm Tube secured with: Tape Dental Injury: Teeth and Oropharynx as per pre-operative assessment

## 2020-10-01 ENCOUNTER — Telehealth: Payer: Self-pay

## 2020-10-01 LAB — SURGICAL PATHOLOGY

## 2020-10-01 NOTE — Telephone Encounter (Signed)
Called  pt to check on her after her surgery from yesterday. Pt states she is feeling okay, taking the pain medication every 6 hours.no fever, chills, no issues with incision sites.  Pt has post op appt scheduled.

## 2020-10-02 LAB — TYPE AND SCREEN
ABO/RH(D): O POS
Antibody Screen: NEGATIVE
Unit division: 0

## 2020-10-02 LAB — BPAM RBC
Blood Product Expiration Date: 202210012359
Unit Type and Rh: 5100

## 2020-10-02 LAB — PREPARE RBC (CROSSMATCH)

## 2020-10-07 ENCOUNTER — Encounter: Payer: Self-pay | Admitting: Obstetrics and Gynecology

## 2020-10-07 ENCOUNTER — Other Ambulatory Visit: Payer: Self-pay

## 2020-10-07 ENCOUNTER — Ambulatory Visit (INDEPENDENT_AMBULATORY_CARE_PROVIDER_SITE_OTHER): Payer: BC Managed Care – PPO | Admitting: Obstetrics and Gynecology

## 2020-10-07 VITALS — BP 128/75 | HR 87 | Ht 67.0 in | Wt 177.0 lb

## 2020-10-07 DIAGNOSIS — Z09 Encounter for follow-up examination after completed treatment for conditions other than malignant neoplasm: Secondary | ICD-10-CM

## 2020-10-07 NOTE — Progress Notes (Signed)
   Postoperative Follow-up Patient presents post op from a robot assisted Total Laparoscopic Hysterectomy, bilateral salpingectomy, cystoscopy  7 days ago for menorrhagia with regular cycle, adenomyosis, fibroids.  Subjective: Patient reports marked improvement in her preop symptoms. Eating a regular diet without difficulty. Pain: patient is using ibuprofen for pain.  Activity: slowly increasing.  She denies fever, chills, nausea, and vomiting. She is passing flatus and has bowel movements every other day.   Pathology Report: A. UTERUS WITH CERVIX AND BILATERAL FALLOPIAN TUBES; TOTAL HYSTERECTOMY  WITH BILATERAL SALPINGECTOMY:  - CERVIX:  - NEGATIVE FOR DYSPLASIA AND MALIGNANCY.  - ENDOMETRIUM:       - SECRETORY.  NEGATIVE FOR ATYPIA / EIN AND MALIGNANCY.  - MYOMETRIUM:       - ADENOMYOSIS, LEIOMYOMATA.  - BILATERAL FALLOPIAN TUBES:       - NO SIGNIFICANT PATHOLOGIC ALTERATION.   Objective: Vital Signs: BP 128/75 (Cuff Size: Normal)   Pulse 87   Ht '5\' 1"'$  (1.549 m)   Wt 177 lb (80.3 kg)   BMI 33.44 kg/m  Physical Exam Constitutional:      General: She is not in acute distress.    Appearance: Normal appearance. She is well-developed.  HENT:     Head: Normocephalic and atraumatic.  Eyes:     General: No scleral icterus.    Conjunctiva/sclera: Conjunctivae normal.  Cardiovascular:     Rate and Rhythm: Normal rate and regular rhythm.     Heart sounds: No murmur heard.   No friction rub. No gallop.  Pulmonary:     Effort: Pulmonary effort is normal. No respiratory distress.     Breath sounds: Normal breath sounds. No wheezing or rales.  Abdominal:     General: Bowel sounds are normal. There is no distension.     Palpations: Abdomen is soft. There is no mass.     Tenderness: There is no abdominal tenderness. There is no guarding or rebound.     Comments: Incisions: without erythema, induration, warmth, and tenderness. They are clean, dry, and intact.     Musculoskeletal:         General: Normal range of motion.     Cervical back: Normal range of motion and neck supple.  Neurological:     General: No focal deficit present.     Mental Status: She is alert and oriented to person, place, and time.     Cranial Nerves: No cranial nerve deficit.  Skin:    General: Skin is warm and dry.     Findings: No erythema.  Psychiatric:        Mood and Affect: Mood normal.        Behavior: Behavior normal.        Judgment: Judgment normal.     Assessment: 45 y.o. s/p above surgery progressing well  Plan: Patient has done well after surgery with no apparent complications.  I have discussed the post-operative course to date, and the expected progress moving forward.  The patient understands what complications to be concerned about.  I will see the patient in routine follow up, or sooner if needed.    Activity plan: increase slowly. No intercourse for 8-12 weeks post-op.  Return in about 5 weeks (around 11/11/2020) for Six week post op visit.   Prentice Docker, MD  10/07/2020, 9:35 AM

## 2020-11-10 ENCOUNTER — Encounter: Payer: Self-pay | Admitting: Obstetrics and Gynecology

## 2020-11-10 ENCOUNTER — Ambulatory Visit (INDEPENDENT_AMBULATORY_CARE_PROVIDER_SITE_OTHER): Payer: BC Managed Care – PPO | Admitting: Obstetrics and Gynecology

## 2020-11-10 ENCOUNTER — Other Ambulatory Visit: Payer: Self-pay

## 2020-11-10 VITALS — BP 125/78 | Ht 67.0 in | Wt 179.6 lb

## 2020-11-10 DIAGNOSIS — N8003 Adenomyosis of the uterus: Secondary | ICD-10-CM

## 2020-11-10 DIAGNOSIS — D500 Iron deficiency anemia secondary to blood loss (chronic): Secondary | ICD-10-CM

## 2020-11-10 DIAGNOSIS — N840 Polyp of corpus uteri: Secondary | ICD-10-CM

## 2020-11-10 DIAGNOSIS — Z09 Encounter for follow-up examination after completed treatment for conditions other than malignant neoplasm: Secondary | ICD-10-CM

## 2020-11-10 DIAGNOSIS — N920 Excessive and frequent menstruation with regular cycle: Secondary | ICD-10-CM

## 2020-11-10 NOTE — Progress Notes (Signed)
Postoperative Follow-up Patient presents post op from robot assisted Total Laparoscopic Hysterectomy, bilateral salpingectomy, cystoscopy 6 weeks ago for menorrhagia with irregular cycle, adenomyosis, endometrial polyp, chronic blood loss anemia.  Subjective: Patient reports marked improvement in her preop symptoms. Eating a regular diet without difficulty. The patient is not having any pain.  Activity: normal activities of daily living.  She denies fever, chills, nausea, and vomiting.   PATHOLOGY REPORT: DIAGNOSIS:  A. UTERUS WITH CERVIX AND BILATERAL FALLOPIAN TUBES; TOTAL HYSTERECTOMY  WITH BILATERAL SALPINGECTOMY:  - CERVIX:  - NEGATIVE FOR DYSPLASIA AND MALIGNANCY.  - ENDOMETRIUM:       - SECRETORY.  NEGATIVE FOR ATYPIA / EIN AND MALIGNANCY.  - MYOMETRIUM:       - ADENOMYOSIS, LEIOMYOMATA.  - BILATERAL FALLOPIAN TUBES:       - NO SIGNIFICANT PATHOLOGIC ALTERATION.   Objective: Vital Signs: BP 125/78   Ht 5\' 7"  (1.702 m)   Wt 179 lb 9.6 oz (81.5 kg)   LMP 07/10/2020 (Exact Date)   BMI 28.13 kg/m  Physical Exam Constitutional:      General: She is not in acute distress.    Appearance: Normal appearance.  Genitourinary:     Bladder and urethral meatus normal.     Right Labia: No rash, tenderness, lesions or skin changes.    Left Labia: No tenderness, lesions, skin changes or rash.    No inguinal adenopathy present in the right or left side.    Pelvic Tanner Score: 5/5.    Vaginal cuff intact.    Perineal sutures intact.    No vaginal erythema or bleeding.     No vaginal prolapse present.     Right Adnexa: not tender, not full and no mass present.    Left Adnexa: not tender, not full and no mass present.    Cervix is absent.     Uterus is absent.     Pelvic exam was performed with patient in the lithotomy position.  HENT:     Head: Normocephalic and atraumatic.  Eyes:     General: No scleral icterus.    Conjunctiva/sclera: Conjunctivae normal.  Abdominal:      General: Bowel sounds are normal. There is no distension.     Palpations: Abdomen is soft.     Tenderness: There is no abdominal tenderness. There is no guarding or rebound.     Hernia: No hernia is present.     Comments: Incisions: without erythema, induration, warmth, and tenderness. They are clean, dry, and intact.     Lymphadenopathy:     Lower Body: No right inguinal adenopathy. No left inguinal adenopathy.  Neurological:     General: No focal deficit present.     Mental Status: She is alert and oriented to person, place, and time.     Cranial Nerves: No cranial nerve deficit.  Psychiatric:        Mood and Affect: Mood normal.        Behavior: Behavior normal.        Judgment: Judgment normal.   Female chaperone present for pelvic exam:   Assessment: 45 y.o. s/p above surgery progressing well  Plan: Patient has done well after surgery with no apparent complications.  I have discussed the post-operative course to date, and the expected progress moving forward.  The patient understands what complications to be concerned about.  I will see the patient in routine follow up, or sooner if needed.    Activity plan: No restriction apart  from limiting lifting to no greater than 25 pounds for the next month.   Prentice Docker, MD  11/10/2020, 9:14 AM  CC: Juline Patch, MD [address] [place],  [place] [address]

## 2021-03-28 ENCOUNTER — Ambulatory Visit (INDEPENDENT_AMBULATORY_CARE_PROVIDER_SITE_OTHER): Payer: BC Managed Care – PPO | Admitting: Family Medicine

## 2021-03-28 ENCOUNTER — Encounter: Payer: Self-pay | Admitting: Family Medicine

## 2021-03-28 ENCOUNTER — Other Ambulatory Visit: Payer: Self-pay

## 2021-03-28 ENCOUNTER — Telehealth: Payer: Self-pay

## 2021-03-28 VITALS — BP 126/84 | HR 92 | Ht 67.0 in | Wt 177.0 lb

## 2021-03-28 DIAGNOSIS — Z0000 Encounter for general adult medical examination without abnormal findings: Secondary | ICD-10-CM | POA: Diagnosis not present

## 2021-03-28 DIAGNOSIS — Z862 Personal history of diseases of the blood and blood-forming organs and certain disorders involving the immune mechanism: Secondary | ICD-10-CM

## 2021-03-28 DIAGNOSIS — Z1211 Encounter for screening for malignant neoplasm of colon: Secondary | ICD-10-CM | POA: Diagnosis not present

## 2021-03-28 DIAGNOSIS — Z1231 Encounter for screening mammogram for malignant neoplasm of breast: Secondary | ICD-10-CM

## 2021-03-28 LAB — HEMOCCULT GUIAC POC 1CARD (OFFICE): Fecal Occult Blood, POC: NEGATIVE

## 2021-03-28 NOTE — Telephone Encounter (Signed)
CALLED PATIENT NO ANSWER LEFT VOICEMAIL FOR A CALL BACK ? ?

## 2021-03-28 NOTE — Progress Notes (Signed)
Date:  03/28/2021   Name:  Ashley Silva   DOB:  1975-09-27   MRN:  [id number]   Chief Complaint: Annual Exam  Patient is a 46 year old female who presents for a comprehensive physical exam. The patient reports the following problems: none. Health maintenance has been reviewed colonoscopy.  Ashley Silva is a 46 y.o. female who presents today for her Complete Annual Exam. She feels well. She reports exercising just started. She reports she is sleeping well.     Lab Results  Component Value Date   NA 138 09/24/2020   K 3.8 09/24/2020   CO2 23 09/24/2020   GLUCOSE 102 (H) 09/24/2020   BUN 13 09/24/2020   CREATININE 0.80 09/24/2020   CALCIUM 9.4 09/24/2020   GFRNONAA >60 09/24/2020   Lab Results  Component Value Date   CHOL 222 (H) 03/25/2020   HDL 56 03/25/2020   LDLCALC 140 (H) 03/25/2020   TRIG 147 03/25/2020   Lab Results  Component Value Date   TSH 2.520 04/12/2020   No results found for: HGBA1C Lab Results  Component Value Date   WBC 5.3 09/24/2020   HGB 8.4 (L) 09/24/2020   HCT 30.2 (L) 09/24/2020   MCV 61.6 (L) 09/24/2020   PLT 274 09/24/2020   Lab Results  Component Value Date   ALT 11 09/24/2020   AST 16 09/24/2020   ALKPHOS 44 09/24/2020   BILITOT 0.7 09/24/2020   No results found for: 25OHVITD2, 25OHVITD3, VD25OH   Review of Systems  Constitutional:  Negative for chills and fever.  HENT:  Negative for drooling, ear discharge, ear pain and sore throat.   Respiratory:  Negative for cough, shortness of breath and wheezing.   Cardiovascular:  Negative for chest pain, palpitations and leg swelling.  Gastrointestinal:  Negative for abdominal pain, blood in stool, constipation, diarrhea and nausea.  Endocrine: Negative for polydipsia.  Genitourinary:  Negative for dysuria, frequency, hematuria and urgency.  Musculoskeletal:  Negative for back pain, myalgias and neck pain.  Skin:  Negative for rash.  Allergic/Immunologic: Negative for environmental  allergies.  Neurological:  Negative for dizziness and headaches.  Hematological:  Does not bruise/bleed easily.  Psychiatric/Behavioral:  Negative for suicidal ideas. The patient is not nervous/anxious.    Patient Active Problem List   Diagnosis Date Noted   Menorrhagia with regular cycle 09/30/2020   Adenomyosis 09/30/2020   Endometrial polyp 09/30/2020   Chronic blood loss anemia 09/30/2020   Seborrheic keratoses 06/16/2019    No Known Allergies  Past Surgical History:  Procedure Laterality Date   CESAREAN SECTION     1   CYSTOSCOPY N/A 09/30/2020   Procedure: CYSTOSCOPY;  Surgeon: Will Bonnet, MD;  Location: [facility];  Service: Gynecology;  Laterality: N/A;   ROBOTIC ASSISTED LAPAROSCOPIC HYSTERECTOMY AND SALPINGECTOMY Bilateral 09/30/2020   Procedure: XI ROBOTIC ASSISTED LAPAROSCOPIC HYSTERECTOMY AND SALPINGECTOMY;  Surgeon: Will Bonnet, MD;  Location: [facility];  Service: Gynecology;  Laterality: Bilateral;    Social History   Tobacco Use   Smoking status: Former    Years: 5.00    Types: Cigarettes    Quit date: 01/23/1996    Years since quitting: 25.1   Smokeless tobacco: Never  Vaping Use   Vaping Use: Never used  Substance Use Topics   Alcohol use: Yes    Comment: occasionally   Drug use: Never     Medication list has been reviewed and updated.  Current Meds  Medication Sig  ibuprofen (ADVIL) 600 MG tablet Take 1 tablet (600 mg total) by mouth every 6 (six) hours.   Probiotic Product (PROBIOTIC MULTI-ENZYME) TABS Take 1 tablet by mouth daily.   [DISCONTINUED] docusate sodium (COLACE) 100 MG capsule Take 100 mg by mouth 2 (two) times daily.    PHQ 2/9 Scores 03/28/2021 04/12/2020 03/19/2020  PHQ - 2 Score 0 1 1  PHQ- 9 Score 0 7 7    GAD 7 : Generalized Anxiety Score 03/28/2021 04/12/2020 03/19/2020  Nervous, Anxious, on Edge 0 2 2  Control/stop worrying 0 1 1  Worry too much - different things 0 1 1  Trouble relaxing 0 1 1  Restless 0 1 1   Easily annoyed or irritable 0 2 2  Afraid - awful might happen 0 0 0  Total GAD 7 Score 0 8 8  Anxiety Difficulty Not difficult at all Not difficult at all Somewhat difficult    BP Readings from Last 3 Encounters:  03/28/21 126/84  11/10/20 125/78  10/07/20 128/75    Physical Exam Vitals and nursing note reviewed.  Constitutional:      Appearance: Normal appearance. She is well-developed and well-groomed.  HENT:     Head: Normocephalic.     Jaw: There is normal jaw occlusion.     Right Ear: Hearing, tympanic membrane, ear canal and external ear normal.     Left Ear: Hearing, tympanic membrane, ear canal and external ear normal.     Nose: Nose normal.     Right Turbinates: Not enlarged, swollen or pale.     Left Turbinates: Not enlarged, swollen or pale.     Right Sinus: No maxillary sinus tenderness or frontal sinus tenderness.     Left Sinus: No maxillary sinus tenderness or frontal sinus tenderness.  Eyes:     General: Lids are normal. Vision grossly intact. Gaze aligned appropriately. No visual field deficit or scleral icterus.       Right eye: No foreign body.        Left eye: No foreign body.     Extraocular Movements: Extraocular movements intact.     Conjunctiva/sclera: Conjunctivae normal.     Right eye: Right conjunctiva is not injected.     Left eye: Left conjunctiva is not injected.     Pupils: Pupils are equal, round, and reactive to light.     Funduscopic exam:    Right eye: Red reflex present.        Left eye: Red reflex present. Neck:     Thyroid: No thyroid mass, thyromegaly or thyroid tenderness.     Vascular: Normal carotid pulses. No carotid bruit, hepatojugular reflux or JVD.     Trachea: Trachea and phonation normal. No tracheal deviation.  Cardiovascular:     Rate and Rhythm: Normal rate and regular rhythm.     Chest Wall: PMI is not displaced. No thrill.     Pulses: Normal pulses. No decreased pulses.          Carotid pulses are 2+ on the right  side and 2+ on the left side.      Radial pulses are 2+ on the right side and 2+ on the left side.       Femoral pulses are 2+ on the right side and 2+ on the left side.      Popliteal pulses are 2+ on the right side and 2+ on the left side.       Dorsalis pedis pulses are 2+ on the  right side and 2+ on the left side.       Posterior tibial pulses are 2+ on the right side and 2+ on the left side.     Heart sounds: Normal heart sounds, S1 normal and S2 normal. No murmur heard. No systolic murmur is present.  No diastolic murmur is present.    No friction rub. No gallop. No S3 or S4 sounds.  Pulmonary:     Effort: Pulmonary effort is normal. No respiratory distress.     Breath sounds: Normal breath sounds and air entry. No decreased air movement. No decreased breath sounds, wheezing, rhonchi or rales.  Chest:  Breasts:    Right: Normal. No swelling, bleeding, inverted nipple or mass.     Left: Normal. No swelling, bleeding, inverted nipple or mass.  Abdominal:     General: Bowel sounds are normal.     Palpations: Abdomen is soft. There is no hepatomegaly, splenomegaly or mass.     Tenderness: There is no abdominal tenderness. There is no right CVA tenderness, left CVA tenderness, guarding or rebound.     Hernia: There is no hernia in the umbilical area or ventral area.  Genitourinary:    Rectum: Normal. Guaiac result negative. No mass.  Musculoskeletal:        General: No tenderness. Normal range of motion.     Cervical back: Normal range of motion and neck supple.     Right lower leg: No edema.     Left lower leg: No edema.  Lymphadenopathy:     Head:     Right side of head: No submental, submandibular or tonsillar adenopathy.     Left side of head: No submental, submandibular or tonsillar adenopathy.     Cervical: No cervical adenopathy.     Right cervical: No superficial, deep or posterior cervical adenopathy.    Left cervical: No superficial, deep or posterior cervical  adenopathy.     Upper Body:     Right upper body: No supraclavicular or axillary adenopathy.     Left upper body: No supraclavicular or axillary adenopathy.  Skin:    General: Skin is warm.     Findings: No rash.  Neurological:     Mental Status: She is alert and oriented to person, place, and time.     Cranial Nerves: Cranial nerves 2-12 are intact. No cranial nerve deficit, dysarthria or facial asymmetry.     Sensory: Sensation is intact.     Motor: Motor function is intact.     Coordination: Romberg sign negative.     Deep Tendon Reflexes: Reflexes normal.  Psychiatric:        Mood and Affect: Mood is not anxious or depressed.        Behavior: Behavior is cooperative.    Wt Readings from Last 3 Encounters:  03/28/21 177 lb (80.3 kg)  11/10/20 179 lb 9.6 oz (81.5 kg)  10/07/20 177 lb (80.3 kg)    BP 126/84    Pulse 92    Ht 5\' 7"  (1.702 m)    Wt 177 lb (80.3 kg)    LMP 07/10/2020 (Exact Date)    BMI 27.72 kg/m   Assessment and Plan:  Chart was reviewed from previous encounters most recent encounters past medical history current medications allergies and labs as well as Care Everywhere. 1. Annual physical exam Immunizations are reviewed and recommendations provided.   Age appropriate screening tests are discussed. Counseling given for risk factor reduction interventions.  No subjective/objective concerns  noted during history and physical exam.  We will obtain CBC and lipid panel. - CBC w/Diff/Platelet - Lipid Panel With LDL/HDL Ratio - POCT Occult Blood Stool  2. History of anemia Patient has a history of heavy periods and is followed by GYN.  We will check CBC for current status of hemoglobin and indices and as well as check ferritin is that she has been on iron in the past. - Ambulatory referral to Gastroenterology - CBC w/Diff/Platelet - Ferritin  3. Breast cancer screening by mammogram Breast exam was normal without palpable mass and referral has been made for  screening mammogram. - MM 3D SCREEN BREAST BILATERAL  4. Colon cancer screening Discussed with patient.  DRE is normal with no palpable mass and guaiac is negative.  We will refer to gastroenterology for evaluation. - Ambulatory referral to Gastroenterology - CBC w/Diff/Platelet - POCT Occult Blood Stool  Immunizations are reviewed and recommendations provided.   Marland Kitchen

## 2021-03-28 NOTE — Telephone Encounter (Signed)
Scheduled for 05/23/2021

## 2021-03-29 LAB — CBC WITH DIFFERENTIAL/PLATELET
Basophils Absolute: 0.1 10*3/uL (ref 0.0–0.2)
Basos: 1 %
EOS (ABSOLUTE): 0.1 10*3/uL (ref 0.0–0.4)
Eos: 2 %
Hematocrit: 36 % (ref 34.0–46.6)
Hemoglobin: 10.6 g/dL — ABNORMAL LOW (ref 11.1–15.9)
Immature Grans (Abs): 0 10*3/uL (ref 0.0–0.1)
Immature Granulocytes: 0 %
Lymphocytes Absolute: 1.9 10*3/uL (ref 0.7–3.1)
Lymphs: 32 %
MCH: 20.5 pg — ABNORMAL LOW (ref 26.6–33.0)
MCHC: 29.4 g/dL — ABNORMAL LOW (ref 31.5–35.7)
MCV: 70 fL — ABNORMAL LOW (ref 79–97)
Monocytes Absolute: 0.4 10*3/uL (ref 0.1–0.9)
Monocytes: 7 %
Neutrophils Absolute: 3.6 10*3/uL (ref 1.4–7.0)
Neutrophils: 58 %
Platelets: 248 10*3/uL (ref 150–450)
RBC: 5.18 x10E6/uL (ref 3.77–5.28)
RDW: 21.4 % — ABNORMAL HIGH (ref 11.7–15.4)
WBC: 6.1 10*3/uL (ref 3.4–10.8)

## 2021-03-29 LAB — LIPID PANEL WITH LDL/HDL RATIO
Cholesterol, Total: 217 mg/dL — ABNORMAL HIGH (ref 100–199)
HDL: 53 mg/dL (ref >39)
LDL Chol Calc (NIH): 147 mg/dL — ABNORMAL HIGH (ref 0–99)
LDL/HDL Ratio: 2.8 ratio (ref 0.0–3.2)
Triglycerides: 96 mg/dL (ref 0–149)
VLDL Cholesterol Cal: 17 mg/dL (ref 5–40)

## 2021-03-29 LAB — FERRITIN: Ferritin: 5 ng/mL — ABNORMAL LOW (ref 15–150)

## 2021-05-16 ENCOUNTER — Ambulatory Visit
Admission: RE | Admit: 2021-05-16 | Discharge: 2021-05-16 | Disposition: A | Discharge status: Home / Self Care | Payer: BC Managed Care – PPO | Source: Ambulatory Visit | Attending: Family Medicine | Admitting: Family Medicine

## 2021-05-16 DIAGNOSIS — Z1231 Encounter for screening mammogram for malignant neoplasm of breast: Secondary | ICD-10-CM | POA: Insufficient documentation

## 2021-05-17 ENCOUNTER — Other Ambulatory Visit: Payer: Self-pay | Admitting: Family Medicine

## 2021-05-17 DIAGNOSIS — N63 Unspecified lump in unspecified breast: Secondary | ICD-10-CM

## 2021-05-17 DIAGNOSIS — R928 Other abnormal and inconclusive findings on diagnostic imaging of breast: Secondary | ICD-10-CM

## 2021-05-22 NOTE — H&P (View-Only) (Signed)
? ? ?Gastroenterology Consultation ? ?Referring Provider:     Juline Patch, MD ?Primary Care Physician:  Juline Patch, MD ?Primary Gastroenterologist:  Dr. Allen Norris     ?Reason for Consultation:     Anemia ?      ? HPI:   ?Ashley Silva is a 46 y.o. y/o female referred for consultation & management of Anemia by Dr. Juline Patch, MD.  This patient comes in today with microcytic anemia.  Her most recent blood work is shown: ? ?Component ?    Latest Ref Rng 07/15/2020 09/24/2020 03/28/2021  ?Hemoglobin ?    11.1 - 15.9 g/dL 8.0 (L)  8.4 (L)  10.6 (L)   ?HCT ?    34.0 - 46.6 % 28.5 (L)  30.2 (L)  36.0   ?MCV ?    79 - 97 fL 63 (L)  61.6 (L)  70 (L)   ? ?The patient also had ferritin a year ago that was noted to be 1 and a month ago with a ferritin showing a level of 5. The patient's occult blood in her stool was negative one year ago and one month ago. The patient had a history of heavy periods and underwent a "ROBOTIC ASSISTED LAPAROSCOPIC HYSTERECTOMY AND SALPINGECTOMY" by Dr. Glennon Mac in September of 2022. ?The patient denies any family history of colon cancer or colon polyps.  The patient also denies any abdominal pain or unexplained weight loss.  She states that she does not have any black stools or bloody stools.  She also denies any abdominal pain. ? ?Past Medical History:  ?Diagnosis Date  ? Hypoglycemia   ? ? ?Past Surgical History:  ?Procedure Laterality Date  ? CESAREAN SECTION    ? 1  ? CYSTOSCOPY N/A 09/30/2020  ? Procedure: CYSTOSCOPY;  Surgeon: Will Bonnet, MD;  Location: [facility];  Service: Gynecology;  Laterality: N/A;  ? ROBOTIC ASSISTED LAPAROSCOPIC HYSTERECTOMY AND SALPINGECTOMY Bilateral 09/30/2020  ? Procedure: XI ROBOTIC ASSISTED LAPAROSCOPIC HYSTERECTOMY AND SALPINGECTOMY;  Surgeon: Will Bonnet, MD;  Location: [facility];  Service: Gynecology;  Laterality: Bilateral;  ? ? ?Prior to Admission medications   ?Medication Sig Start Date End Date Taking? Authorizing Provider  ?ibuprofen  (ADVIL) 600 MG tablet Take 1 tablet (600 mg total) by mouth every 6 (six) hours. 09/30/20   Will Bonnet, MD  ?Probiotic Product (PROBIOTIC MULTI-ENZYME) TABS Take 1 tablet by mouth daily.    [provider]  ?albuterol (PROVENTIL HFA;VENTOLIN HFA) 108 (90 Base) MCG/ACT inhaler Inhale 2 puffs into the lungs every 6 (six) hours as needed for wheezing or shortness of breath. 10/26/17 01/07/19  Eula Listen, MD  ? ? ?Family History  ?Problem Relation Age of Onset  ? Hypertension Mother   ? Other Father   ?     unknown medical history  ? Heart disease Maternal Grandmother   ? Stroke Maternal Grandfather   ? Breast cancer Neg Hx   ?  ? ?Social History  ? ?Tobacco Use  ? Smoking status: Former  ?  Years: 5.00  ?  Types: Cigarettes  ?  Quit date: 01/23/1996  ?  Years since quitting: 25.3  ? Smokeless tobacco: Never  ?Vaping Use  ? Vaping Use: Never used  ?Substance Use Topics  ? Alcohol use: Yes  ?  Comment: occasionally  ? Drug use: Never  ? ? ?Allergies as of 05/23/2021  ? (No Known Allergies)  ? ? ?Review of Systems:    ?All systems  reviewed and negative except where noted in HPI. ? ? Physical Exam:  ?LMP 07/10/2020 (Exact Date)  ?Patient's last menstrual period was 07/10/2020 (exact date). ?General:   Alert,  Well-developed, well-nourished, pleasant and cooperative in NAD ?Head:  Normocephalic and atraumatic. ?Eyes:  Sclera clear, no icterus.   Conjunctiva pink. ?Ears:  Normal auditory acuity. ?Neck:  Supple; no masses or thyromegaly. ?Lungs:  Respirations even and unlabored.  Clear throughout to auscultation.   No wheezes, crackles, or rhonchi. No acute distress. ?Heart:  Regular rate and rhythm; no murmurs, clicks, rubs, or gallops. ?Abdomen:  Normal bowel sounds.  No bruits.  Soft, non-tender and non-distended without masses, hepatosplenomegaly or hernias noted.  No guarding or rebound tenderness.  Negative Carnett sign.   ?Rectal:  Deferred.  ?Pulses:  Normal pulses noted. ?Extremities:  No  clubbing or edema.  No cyanosis. ?Neurologic:  Alert and oriented x3;  grossly normal neurologically. ?Skin:  Intact without significant lesions or rashes.  No jaundice. ?Lymph Nodes:  No significant cervical adenopathy. ?Psych:  Alert and cooperative. Normal mood and affect. ? ?Imaging Studies: ?MM 3D SCREEN BREAST BILATERAL ? ?Result Date: 05/17/2021 ?CLINICAL DATA:  Screening. EXAM: DIGITAL SCREENING BILATERAL MAMMOGRAM WITH TOMOSYNTHESIS AND CAD TECHNIQUE: Bilateral screening digital craniocaudal and mediolateral oblique mammograms were obtained. Bilateral screening digital breast tomosynthesis was performed. The images were evaluated with computer-aided detection. COMPARISON:  Previous exam(s). ACR Breast Density Category b: There are scattered areas of fibroglandular density. FINDINGS: In the right breast, a possible mass warrants further evaluation. In the left breast, no findings suspicious for malignancy. IMPRESSION: Further evaluation is suggested for a possible mass in the right breast. RECOMMENDATION: Diagnostic mammogram and possibly ultrasound of the right breast. (Code:[id number]) The patient will be contacted regarding the findings, and additional imaging will be scheduled. BI-RADS CATEGORY  0: Incomplete. Need additional imaging evaluation and/or prior mammograms for comparison. Electronically Signed   By: Abelardo Diesel M.D.   On: 05/17/2021 07:40   ? ?Assessment and Plan:  ? ?Ashley Silva is a 46 y.o. y/o female who comes in today with a history of heavy periods when she had a uterus and was anemic at that time but since her hysterectomy she continues to be anemic despite any signs of bleeding.  The patient will have her blood sent off for celiac sprue and will also be set up for an EGD and colonoscopy.  The patient has been told that these are negative the patient may need a capsule endoscopy.  The patient has been explained the plan and agrees with it. ? ? ? ?Lucilla Lame, MD. Marval Regal ? ? ? Note: This  dictation was prepared with Dragon dictation along with smaller phrase technology. Any transcriptional errors that result from this process are unintentional.   ?

## 2021-05-22 NOTE — Progress Notes (Signed)
? ? ?Gastroenterology Consultation ? ?Referring Provider:     Juline Patch, MD ?Primary Care Physician:  Juline Patch, MD ?Primary Gastroenterologist:  Dr. Allen Norris     ?Reason for Consultation:     Anemia ?      ? HPI:   ?Ashley Silva is a 46 y.o. y/o female referred for consultation & management of Anemia by Dr. Juline Patch, MD.  This patient comes in today with microcytic anemia.  Her most recent blood work is shown: ? ?Component ?    Latest Ref Rng 07/15/2020 09/24/2020 03/28/2021  ?Hemoglobin ?    11.1 - 15.9 g/dL 8.0 (L)  8.4 (L)  10.6 (L)   ?HCT ?    34.0 - 46.6 % 28.5 (L)  30.2 (L)  36.0   ?MCV ?    79 - 97 fL 63 (L)  61.6 (L)  70 (L)   ? ?The patient also had ferritin a year ago that was noted to be 1 and a month ago with a ferritin showing a level of 5. The patient's occult blood in her stool was negative one year ago and one month ago. The patient had a history of heavy periods and underwent a "ROBOTIC ASSISTED LAPAROSCOPIC HYSTERECTOMY AND SALPINGECTOMY" by Dr. Glennon Mac in September of 2022. ?The patient denies any family history of colon cancer or colon polyps.  The patient also denies any abdominal pain or unexplained weight loss.  She states that she does not have any black stools or bloody stools.  She also denies any abdominal pain. ? ?Past Medical History:  ?Diagnosis Date  ? Hypoglycemia   ? ? ?Past Surgical History:  ?Procedure Laterality Date  ? CESAREAN SECTION    ? 1  ? CYSTOSCOPY N/A 09/30/2020  ? Procedure: CYSTOSCOPY;  Surgeon: Will Bonnet, MD;  Location: [facility] ORS;  Service: Gynecology;  Laterality: N/A;  ? ROBOTIC ASSISTED LAPAROSCOPIC HYSTERECTOMY AND SALPINGECTOMY Bilateral 09/30/2020  ? Procedure: XI ROBOTIC ASSISTED LAPAROSCOPIC HYSTERECTOMY AND SALPINGECTOMY;  Surgeon: Will Bonnet, MD;  Location: [facility] ORS;  Service: Gynecology;  Laterality: Bilateral;  ? ? ?Prior to Admission medications   ?Medication Sig Start Date End Date Taking? Authorizing Provider  ?ibuprofen  (ADVIL) 600 MG tablet Take 1 tablet (600 mg total) by mouth every 6 (six) hours. 09/30/20   Will Bonnet, MD  ?Probiotic Product (PROBIOTIC MULTI-ENZYME) TABS Take 1 tablet by mouth daily.    [provider]  ?albuterol (PROVENTIL HFA;VENTOLIN HFA) 108 (90 Base) MCG/ACT inhaler Inhale 2 puffs into the lungs every 6 (six) hours as needed for wheezing or shortness of breath. 10/26/17 01/07/19  Eula Listen, MD  ? ? ?Family History  ?Problem Relation Age of Onset  ? Hypertension Mother   ? Other Father   ?     unknown medical history  ? Heart disease Maternal Grandmother   ? Stroke Maternal Grandfather   ? Breast cancer Neg Hx   ?  ? ?Social History  ? ?Tobacco Use  ? Smoking status: Former  ?  Years: 5.00  ?  Types: Cigarettes  ?  Quit date: 01/23/1996  ?  Years since quitting: 25.3  ? Smokeless tobacco: Never  ?Vaping Use  ? Vaping Use: Never used  ?Substance Use Topics  ? Alcohol use: Yes  ?  Comment: occasionally  ? Drug use: Never  ? ? ?Allergies as of 05/23/2021  ? (No Known Allergies)  ? ? ?Review of Systems:    ?All systems  reviewed and negative except where noted in HPI. ? ? Physical Exam:  ?LMP 07/10/2020 (Exact Date)  ?Patient's last menstrual period was 07/10/2020 (exact date). ?General:   Alert,  Well-developed, well-nourished, pleasant and cooperative in NAD ?Head:  Normocephalic and atraumatic. ?Eyes:  Sclera clear, no icterus.   Conjunctiva pink. ?Ears:  Normal auditory acuity. ?Neck:  Supple; no masses or thyromegaly. ?Lungs:  Respirations even and unlabored.  Clear throughout to auscultation.   No wheezes, crackles, or rhonchi. No acute distress. ?Heart:  Regular rate and rhythm; no murmurs, clicks, rubs, or gallops. ?Abdomen:  Normal bowel sounds.  No bruits.  Soft, non-tender and non-distended without masses, hepatosplenomegaly or hernias noted.  No guarding or rebound tenderness.  Negative Carnett sign.   ?Rectal:  Deferred.  ?Pulses:  Normal pulses noted. ?Extremities:  No  clubbing or edema.  No cyanosis. ?Neurologic:  Alert and oriented x3;  grossly normal neurologically. ?Skin:  Intact without significant lesions or rashes.  No jaundice. ?Lymph Nodes:  No significant cervical adenopathy. ?Psych:  Alert and cooperative. Normal mood and affect. ? ?Imaging Studies: ?MM 3D SCREEN BREAST BILATERAL ? ?Result Date: 05/17/2021 ?CLINICAL DATA:  Screening. EXAM: DIGITAL SCREENING BILATERAL MAMMOGRAM WITH TOMOSYNTHESIS AND CAD TECHNIQUE: Bilateral screening digital craniocaudal and mediolateral oblique mammograms were obtained. Bilateral screening digital breast tomosynthesis was performed. The images were evaluated with computer-aided detection. COMPARISON:  Previous exam(s). ACR Breast Density Category b: There are scattered areas of fibroglandular density. FINDINGS: In the right breast, a possible mass warrants further evaluation. In the left breast, no findings suspicious for malignancy. IMPRESSION: Further evaluation is suggested for a possible mass in the right breast. RECOMMENDATION: Diagnostic mammogram and possibly ultrasound of the right breast. (Code:[id number]-R-25M) The patient will be contacted regarding the findings, and additional imaging will be scheduled. BI-RADS CATEGORY  0: Incomplete. Need additional imaging evaluation and/or prior mammograms for comparison. Electronically Signed   By: Abelardo Diesel M.D.   On: 05/17/2021 07:40   ? ?Assessment and Plan:  ? ?Ashley Silva is a 46 y.o. y/o female who comes in today with a history of heavy periods when she had a uterus and was anemic at that time but since her hysterectomy she continues to be anemic despite any signs of bleeding.  The patient will have her blood sent off for celiac sprue and will also be set up for an EGD and colonoscopy.  The patient has been told that these are negative the patient may need a capsule endoscopy.  The patient has been explained the plan and agrees with it. ? ? ? ?Lucilla Lame, MD. Marval Regal ? ? ? Note: This  dictation was prepared with Dragon dictation along with smaller phrase technology. Any transcriptional errors that result from this process are unintentional.   ?

## 2021-05-23 ENCOUNTER — Encounter: Payer: Self-pay | Admitting: Gastroenterology

## 2021-05-23 ENCOUNTER — Ambulatory Visit (INDEPENDENT_AMBULATORY_CARE_PROVIDER_SITE_OTHER): Payer: BC Managed Care – PPO | Admitting: Gastroenterology

## 2021-05-23 VITALS — BP 157/93 | HR 73 | Temp 98.1°F | Ht 67.0 in | Wt 175.0 lb

## 2021-05-23 DIAGNOSIS — D500 Iron deficiency anemia secondary to blood loss (chronic): Secondary | ICD-10-CM | POA: Diagnosis not present

## 2021-05-23 DIAGNOSIS — R197 Diarrhea, unspecified: Secondary | ICD-10-CM

## 2021-05-23 MED ORDER — NA SULFATE-K SULFATE-MG SULF 17.5-3.13-1.6 GM/177ML PO SOLN: 1.0000 | Freq: Once | ORAL | 0 refills | Status: AC

## 2021-05-23 NOTE — Addendum Note (Signed)
Addended by: Lurlean Nanny on: 05/23/2021 04:39 PM ? ? Modules accepted: Orders ? ?

## 2021-05-24 ENCOUNTER — Ambulatory Visit
Admission: RE | Admit: 2021-05-24 | Discharge: 2021-05-24 | Disposition: A | Discharge status: Home / Self Care | Payer: BC Managed Care – PPO | Source: Ambulatory Visit | Attending: Family Medicine | Admitting: Family Medicine

## 2021-05-24 DIAGNOSIS — N63 Unspecified lump in unspecified breast: Secondary | ICD-10-CM | POA: Diagnosis not present

## 2021-05-24 DIAGNOSIS — R928 Other abnormal and inconclusive findings on diagnostic imaging of breast: Secondary | ICD-10-CM | POA: Insufficient documentation

## 2021-05-25 LAB — CELIAC DISEASE PANEL
Endomysial IgA: NEGATIVE
IgA/Immunoglobulin A, Serum: 268 mg/dL (ref 87–352)
Transglutaminase IgA: <2 U/mL (ref 0–3)

## 2021-05-26 ENCOUNTER — Encounter: Payer: Self-pay | Admitting: Gastroenterology

## 2021-06-01 ENCOUNTER — Encounter: Payer: Self-pay | Admitting: Gastroenterology

## 2021-06-10 ENCOUNTER — Ambulatory Visit: Payer: BC Managed Care – PPO | Admitting: Anesthesiology

## 2021-06-10 ENCOUNTER — Encounter
Admission: RE | Disposition: A | Discharge status: Home / Self Care | Payer: Self-pay | Source: Home / Self Care | Attending: Gastroenterology

## 2021-06-10 ENCOUNTER — Other Ambulatory Visit: Payer: Self-pay

## 2021-06-10 ENCOUNTER — Ambulatory Visit
Admission: RE | Admit: 2021-06-10 | Discharge: 2021-06-10 | Disposition: A | Discharge status: Home / Self Care | Payer: BC Managed Care – PPO | Attending: Gastroenterology | Admitting: Gastroenterology

## 2021-06-10 ENCOUNTER — Encounter: Payer: Self-pay | Admitting: Gastroenterology

## 2021-06-10 DIAGNOSIS — Z87891 Personal history of nicotine dependence: Secondary | ICD-10-CM | POA: Diagnosis not present

## 2021-06-10 DIAGNOSIS — D122 Benign neoplasm of ascending colon: Secondary | ICD-10-CM | POA: Insufficient documentation

## 2021-06-10 DIAGNOSIS — K640 First degree hemorrhoids: Secondary | ICD-10-CM | POA: Diagnosis not present

## 2021-06-10 DIAGNOSIS — D500 Iron deficiency anemia secondary to blood loss (chronic): Secondary | ICD-10-CM

## 2021-06-10 DIAGNOSIS — K635 Polyp of colon: Secondary | ICD-10-CM

## 2021-06-10 DIAGNOSIS — D509 Iron deficiency anemia, unspecified: Secondary | ICD-10-CM | POA: Insufficient documentation

## 2021-06-10 HISTORY — PX: POLYPECTOMY: SHX5525

## 2021-06-10 HISTORY — PX: ESOPHAGOGASTRODUODENOSCOPY: SHX5428

## 2021-06-10 HISTORY — PX: COLONOSCOPY: SHX5424

## 2021-06-10 SURGERY — COLONOSCOPY
Anesthesia: General | Site: Rectum

## 2021-06-10 MED ORDER — STERILE WATER FOR IRRIGATION IR SOLN
Status: DC | PRN
  Administered 2021-06-10: 100 mL

## 2021-06-10 MED ORDER — LIDOCAINE HCL (CARDIAC) PF 100 MG/5ML IV SOSY
PREFILLED_SYRINGE | INTRAVENOUS | Status: DC | PRN
  Administered 2021-06-10: 50 mg via INTRAVENOUS

## 2021-06-10 MED ORDER — PROPOFOL 10 MG/ML IV BOLUS
INTRAVENOUS | Status: DC | PRN
  Administered 2021-06-10: 20 mg via INTRAVENOUS
  Administered 2021-06-10: 30 mg via INTRAVENOUS
  Administered 2021-06-10 (×6): 20 mg via INTRAVENOUS
  Administered 2021-06-10: 90 mg via INTRAVENOUS
  Administered 2021-06-10: 20 mg via INTRAVENOUS

## 2021-06-10 MED ORDER — OXYCODONE HCL 5 MG/5ML PO SOLN: 5.0000 mg | Freq: Once | ORAL | Status: DC | PRN

## 2021-06-10 MED ORDER — OXYCODONE HCL 5 MG PO TABS: 5.0000 mg | ORAL_TABLET | Freq: Once | ORAL | Status: DC | PRN

## 2021-06-10 MED ORDER — FENTANYL CITRATE PF 50 MCG/ML IJ SOSY: 25.0000 ug | PREFILLED_SYRINGE | INTRAMUSCULAR | Status: DC | PRN

## 2021-06-10 MED ORDER — SODIUM CHLORIDE 0.9 % IV SOLN: INTRAVENOUS | Status: DC

## 2021-06-10 MED ORDER — LACTATED RINGERS IV SOLN: INTRAVENOUS | Status: DC

## 2021-06-10 MED ORDER — STERILE WATER FOR IRRIGATION IR SOLN
Status: DC | PRN
  Administered 2021-06-10: 60 mL

## 2021-06-10 MED ORDER — GLYCOPYRROLATE 0.2 MG/ML IJ SOLN
INTRAMUSCULAR | Status: DC | PRN
  Administered 2021-06-10: .1 mg via INTRAVENOUS

## 2021-06-10 MED ORDER — MEPERIDINE HCL 25 MG/ML IJ SOLN: 6.2500 mg | INTRAMUSCULAR | Status: DC | PRN

## 2021-06-10 SURGICAL SUPPLY — 18 items
BALLN DILATOR 10-12 8 (BALLOONS)
BALLN DILATOR CRE 0-12 8 (BALLOONS)
BALLOON DILATOR CRE 0-12 8 (BALLOONS) IMPLANT
BLOCK BITE 60FR ADLT L/F GRN (MISCELLANEOUS) ×3 IMPLANT
FCP ESCP3.2XJMB 240X2.8X (MISCELLANEOUS)
FORCEPS BIOP RAD 4 LRG CAP 4 (CUTTING FORCEPS) ×1 IMPLANT
FORCEPS BIOP RJ4 240 W/NDL (MISCELLANEOUS)
FORCEPS ESCP3.2XJMB 240X2.8X (MISCELLANEOUS) IMPLANT
GOWN CVR UNV OPN BCK APRN NK (MISCELLANEOUS) ×8 IMPLANT
GOWN ISOL THUMB LOOP REG UNIV (MISCELLANEOUS) ×12
KIT PRC NS LF DISP ENDO (KITS) ×4 IMPLANT
KIT PROCEDURE OLYMPUS (KITS) ×6
MANIFOLD NEPTUNE II (INSTRUMENTS) ×6 IMPLANT
RETRIEVER NET ROTH 2.5X230 LF (MISCELLANEOUS) IMPLANT
SNARE COLD EXACTO (MISCELLANEOUS) ×1 IMPLANT
SNARE SHORT THROW 13M SML OVAL (MISCELLANEOUS) IMPLANT
TRAP ETRAP POLY (MISCELLANEOUS) ×1 IMPLANT
WATER STERILE IRR 250ML POUR (IV SOLUTION) ×6 IMPLANT

## 2021-06-10 NOTE — Transfer of Care (Signed)
Immediate Anesthesia Transfer of Care Note ? ?Patient: Ashley Silva ? ?Procedure(s) Performed: COLONOSCOPY WITH BIOPSY (Rectum) ?POLYPECTOMY (Rectum) ?ESOPHAGOGASTRODUODENOSCOPY (EGD) WITH BIOPSY (Mouth) ? ?Patient Location: PACU ? ?Anesthesia Type: General ? ?Level of Consciousness: awake, alert  and patient cooperative ? ?Airway and Oxygen Therapy: Patient Spontanous Breathing and Patient connected to supplemental oxygen ? ?Post-op Assessment: Post-op Vital signs reviewed, Patient's Cardiovascular Status Stable, Respiratory Function Stable, Patent Airway and No signs of Nausea or vomiting ? ?Post-op Vital Signs: Reviewed and stable ? ?Complications: No notable events documented. ? ?

## 2021-06-10 NOTE — Op Note (Addendum)
[facility] ?Gastroenterology ?Patient Name: Ashley Silva ?Procedure Date: 06/10/2021 10:22 AM ?MRN: [id number] ?Account #: [id number] ?Date of Birth: December 16, 1975 ?Admit Type: Outpatient ?Age: 46 ?Room: [facility] OR ROOM 01 ?Gender: Female ?Note Status: Finalized ?Instrument Name: [id number] ?Procedure:             Colonoscopy ?Indications:           Iron deficiency anemia ?Providers:             Lucilla Lame MD, MD ?Referring MD:          Juline Patch, MD (Referring MD) ?Medicines:             Propofol per Anesthesia ?Complications:         No immediate complications. ?Procedure:             Pre-Anesthesia Assessment: ?                       - Prior to the procedure, a History and Physical was  ?                       performed, and patient medications and allergies were  ?                       reviewed. The patient's tolerance of previous  ?                       anesthesia was also reviewed. The risks and benefits  ?                       of the procedure and the sedation options and risks  ?                       were discussed with the patient. All questions were  ?                       answered, and informed consent was obtained. Prior  ?                       Anticoagulants: The patient has taken no previous  ?                       anticoagulant or antiplatelet agents. ASA Grade  ?                       Assessment: II - A patient with mild systemic disease.  ?                       After reviewing the risks and benefits, the patient  ?                       was deemed in satisfactory condition to undergo the  ?                       procedure. ?                       After obtaining informed consent, the colonoscope was  ?  passed under direct vision. Throughout the procedure,  ?                       the patient's blood pressure, pulse, and oxygen  ?                       saturations were monitored continuously. The  ?                       Colonoscope was introduced through  the anus and  ?                       advanced to the the cecum, identified by appendiceal  ?                       orifice and ileocecal valve. The colonoscopy was  ?                       performed without difficulty. The patient tolerated  ?                       the procedure well. The quality of the bowel  ?                       preparation was excellent. ?Findings: ?     The perianal and digital rectal examinations were normal. ?     A 3 mm polyp was found in the ascending colon. The polyp was sessile.  ?     The polyp was removed with a cold biopsy forceps. Resection and  ?     retrieval were complete. ?     Non-bleeding internal hemorrhoids were found during retroflexion. The  ?     hemorrhoids were Grade I (internal hemorrhoids that do not prolapse). ?Impression:            - One 3 mm polyp in the ascending colon, removed with  ?                       a cold biopsy forceps. Resected and retrieved. ?                       - Non-bleeding internal hemorrhoids. ?Recommendation:        - Discharge patient to home. ?                       - Resume previous diet. ?                       - Continue present medications. ?                       - Await pathology results. ?                       - If the pathology report reveals adenomatous tissue,  ?                       then repeat the colonoscopy for surveillance in 7  ?  years. ?                       - To visualize the small bowel, perform video capsule  ?                       endoscopy. ?Procedure Code(s):     --- Professional --- ?                       [phone], Colonoscopy, flexible; with biopsy, single or  ?                       multiple ?Diagnosis Code(s):     --- Professional --- ?                       D50.9, Iron deficiency anemia, unspecified ?                       K63.5, Polyp of colon ?CPT copyright 2019 American Medical Association. All rights reserved. ?The codes documented in this report are preliminary and upon coder review  may  ?be revised to meet current compliance requirements. ?Lucilla Lame MD, MD ?06/10/2021 10:51:41 AM ?This report has been signed electronically. ?Number of Addenda: 0 ?Note Initiated On: 06/10/2021 10:22 AM ?Scope Withdrawal Time: 0 hours 7 minutes 38 seconds  ?Total Procedure Duration: 0 hours 12 minutes 15 seconds  ?Estimated Blood Loss:  Estimated blood loss: none. ?     [facility] ?

## 2021-06-10 NOTE — Op Note (Signed)
[facility] ?Gastroenterology ?Patient Name: Ashley Silva ?Procedure Date: 06/10/2021 10:23 AM ?MRN: [id number] ?Account #: [id number] ?Date of Birth: May 15, 1975 ?Admit Type: Outpatient ?Age: 46 ?Room: [facility] OR ROOM 1 ?Gender: Female ?Note Status: Finalized ?Instrument Name: [id number] ?Procedure:             Upper GI endoscopy ?Indications:           Iron deficiency anemia ?Providers:             Lucilla Lame MD, MD ?Referring MD:          Juline Patch, MD (Referring MD) ?Medicines:             Propofol per Anesthesia ?Complications:         No immediate complications. Estimated blood loss: None. ?Procedure:             Pre-Anesthesia Assessment: ?                       - Prior to the procedure, a History and Physical was  ?                       performed, and patient medications and allergies were  ?                       reviewed. The patient's tolerance of previous  ?                       anesthesia was also reviewed. The risks and benefits  ?                       of the procedure and the sedation options and risks  ?                       were discussed with the patient. All questions were  ?                       answered, and informed consent was obtained. Prior  ?                       Anticoagulants: The patient has taken no previous  ?                       anticoagulant or antiplatelet agents. ASA Grade  ?                       Assessment: II - A patient with mild systemic disease.  ?                       After reviewing the risks and benefits, the patient  ?                       was deemed in satisfactory condition to undergo the  ?                       procedure. ?                       After obtaining informed consent, the endoscope was  ?  passed under direct vision. Throughout the procedure,  ?                       the patient's blood pressure, pulse, and oxygen  ?                       saturations were monitored continuously. The Endoscope  ?                        was introduced through the mouth, and advanced to the  ?                       second part of duodenum. The upper GI endoscopy was  ?                       accomplished without difficulty. The patient tolerated  ?                       the procedure well. ?Findings: ?     The examined esophagus was normal. ?     The entire examined stomach was normal. ?     The examined duodenum was normal. Biopsies were taken with a cold  ?     forceps for histology. ?Impression:            - Normal esophagus. ?                       - Normal stomach. ?                       - Normal examined duodenum. Biopsied. ?Recommendation:        - Discharge patient to home. ?                       - Resume previous diet. ?                       - Continue present medications. ?                       - Await pathology results. ?                       - Perform a colonoscopy today. ?Procedure Code(s):     --- Professional --- ?                       [phone], Esophagogastroduodenoscopy, flexible,  ?                       transoral; with biopsy, single or multiple ?Diagnosis Code(s):     --- Professional --- ?                       D50.9, Iron deficiency anemia, unspecified ?CPT copyright 2019 American Medical Association. All rights reserved. ?The codes documented in this report are preliminary and upon coder review may  ?be revised to meet current compliance requirements. ?Lucilla Lame MD, MD ?06/10/2021 10:35:40 AM ?This report has been signed electronically. ?Number of Addenda: 0 ?Note Initiated On: 06/10/2021 10:23 AM ?Total Procedure Duration: 0 hours 2 minutes 54 seconds  ?Estimated Blood Loss:  Estimated  blood loss: none. ?     [facility] ?

## 2021-06-10 NOTE — Anesthesia Procedure Notes (Signed)
Date/Time: 06/10/2021 10:28 AM ?Performed by: Mayme Genta, CRNA ?Pre-anesthesia Checklist: Patient identified, Emergency Drugs available, Suction available, Timeout performed and Patient being monitored ?Patient Re-evaluated:Patient Re-evaluated prior to induction ?Oxygen Delivery Method: Nasal cannula ?Placement Confirmation: positive ETCO2 ? ? ? ? ?

## 2021-06-10 NOTE — Anesthesia Preprocedure Evaluation (Signed)
Anesthesia Evaluation  ?Patient identified by MRN, date of birth, ID band ?Patient awake ? ? ? ?Reviewed: ?Allergy & Precautions, H&P , NPO status , Patient's Chart, lab work & pertinent test results, reviewed documented beta blocker date and time  ? ?Airway ?Mallampati: II ? ?TM Distance: >3 FB ?Neck ROM: full ? ? ? Dental ?no notable dental hx. ? ?  ?Pulmonary ?neg pulmonary ROS, former smoker,  ?  ?Pulmonary exam normal ?breath sounds clear to auscultation ? ? ? ? ? ? Cardiovascular ?Exercise Tolerance: Good ?negative cardio ROS ? ? ?Rhythm:regular Rate:Normal ? ? ?  ?Neuro/Psych ?negative neurological ROS ? negative psych ROS  ? GI/Hepatic ?negative GI ROS, Neg liver ROS,   ?Endo/Other  ?negative endocrine ROS ? Renal/GU ?negative Renal ROS  ?negative genitourinary ?  ?Musculoskeletal ? ? Abdominal ?  ?Peds ? Hematology ? ?(+) Blood dyscrasia, anemia ,   ?Anesthesia Other Findings ? ? Reproductive/Obstetrics ?negative OB ROS ? ?  ? ? ? ? ? ? ? ? ? ? ? ? ? ?  ?  ? ? ? ? ? ? ? ? ?Anesthesia Physical ?Anesthesia Plan ? ?ASA: 2 ? ?Anesthesia Plan: General  ? ?Post-op Pain Management:   ? ?Induction:  ? ?PONV Risk Score and Plan: 3 and Propofol infusion and Treatment may vary due to age or medical condition ? ?Airway Management Planned:  ? ?Additional Equipment:  ? ?Intra-op Plan:  ? ?Post-operative Plan:  ? ?Informed Consent: I have reviewed the patients History and Physical, chart, labs and discussed the procedure including the risks, benefits and alternatives for the proposed anesthesia with the patient or authorized representative who has indicated his/her understanding and acceptance.  ? ? ? ?Dental Advisory Given ? ?Plan Discussed with: CRNA ? ?Anesthesia Plan Comments:   ? ? ? ? ? ? ?Anesthesia Quick Evaluation ? ?

## 2021-06-10 NOTE — Interval H&P Note (Signed)
? ?Lucilla Lame, MD [facility] ?[address] ?[place], [place] [address] ?Phone:[phone] ?Fax : [phone] ? ?Primary Care Physician:  Juline Patch, MD ?Primary Gastroenterologist:  Dr. Allen Norris ? ?Pre-Procedure History & Physical: ?HPI:  Ashley Silva is a 46 y.o. female is here for an endoscopy and colonoscopy. ?  ?Past Medical History:  ?Diagnosis Date  ? Hypoglycemia   ? ? ?Past Surgical History:  ?Procedure Laterality Date  ? CESAREAN SECTION    ? 1  ? CYSTOSCOPY N/A 09/30/2020  ? Procedure: CYSTOSCOPY;  Surgeon: Will Bonnet, MD;  Location: [facility] ORS;  Service: Gynecology;  Laterality: N/A;  ? ROBOTIC ASSISTED LAPAROSCOPIC HYSTERECTOMY AND SALPINGECTOMY Bilateral 09/30/2020  ? Procedure: XI ROBOTIC ASSISTED LAPAROSCOPIC HYSTERECTOMY AND SALPINGECTOMY;  Surgeon: Will Bonnet, MD;  Location: [facility] ORS;  Service: Gynecology;  Laterality: Bilateral;  ? ? ?Prior to Admission medications   ?Medication Sig Start Date End Date Taking? Authorizing Provider  ?ibuprofen (ADVIL) 600 MG tablet Take 1 tablet (600 mg total) by mouth every 6 (six) hours. 09/30/20  Yes Will Bonnet, MD  ?Probiotic Product (PROBIOTIC MULTI-ENZYME) TABS Take 1 tablet by mouth daily.   Yes [provider]  ?albuterol (PROVENTIL HFA;VENTOLIN HFA) 108 (90 Base) MCG/ACT inhaler Inhale 2 puffs into the lungs every 6 (six) hours as needed for wheezing or shortness of breath. 10/26/17 01/07/19  Eula Listen, MD  ? ? ?Allergies as of 05/23/2021  ? (No Known Allergies)  ? ? ?Family History  ?Problem Relation Age of Onset  ? Hypertension Mother   ? Other Father   ?     unknown medical history  ? Heart disease Maternal Grandmother   ? Stroke Maternal Grandfather   ? Breast cancer Neg Hx   ? ? ?Social History  ? ?Socioeconomic History  ? Marital status: Married  ?  Spouse name: Shanon Brow  ? Number of children: 2  ? Years of education: Not on file  ? Highest education level: Not on file  ?Occupational History  ? Not on file   ?Tobacco Use  ? Smoking status: Former  ?  Years: 5.00  ?  Types: Cigarettes  ?  Quit date: 01/23/1996  ?  Years since quitting: 25.3  ? Smokeless tobacco: Never  ?Vaping Use  ? Vaping Use: Never used  ?Substance and Sexual Activity  ? Alcohol use: Yes  ?  Comment: occasionally  ? Drug use: Never  ? Sexual activity: Not Currently  ?Other Topics Concern  ? Not on file  ?Social History Narrative  ? Not on file  ? ?Social Determinants of Health  ? ?Financial Resource Strain: Not on file  ?Food Insecurity: Not on file  ?Transportation Needs: Not on file  ?Physical Activity: Not on file  ?Stress: Not on file  ?Social Connections: Not on file  ?Intimate Partner Violence: Not on file  ? ? ?Review of Systems: ?See HPI, otherwise negative ROS ? ?Physical Exam: ?BP 135/81   Pulse (!) 50   Temp 97.7 ?F (36.5 ?C) (Temporal)   Resp 16   Ht '5\' 7"'$  (1.702 m)   Wt 74.8 kg   LMP 07/10/2020 (Exact Date)   SpO2 100%   BMI 25.84 kg/m?  ?General:   Alert,  pleasant and cooperative in NAD ?Head:  Normocephalic and atraumatic. ?Neck:  Supple; no masses or thyromegaly. ?Lungs:  Clear throughout to auscultation.    ?Heart:  Regular rate and rhythm. ?Abdomen:  Soft, nontender and nondistended. Normal bowel sounds, without guarding,  and without rebound.   ?Neurologic:  Alert and  oriented x4;  grossly normal neurologically. ? ?Impression/Plan: ?Ashley Silva is here for an endoscopy and colonoscopy to be performed for IDA ? ?Risks, benefits, limitations, and alternatives regarding  endoscopy and colonoscopy have been reviewed with the patient.  Questions have been answered.  All parties agreeable. ? ? ?Lucilla Lame, MD  06/10/2021, 9:46 AM ?

## 2021-06-10 NOTE — Anesthesia Postprocedure Evaluation (Signed)
Anesthesia Post Note ? ?Patient: Kina Shiffman ? ?Procedure(s) Performed: COLONOSCOPY WITH BIOPSY (Rectum) ?POLYPECTOMY (Rectum) ?ESOPHAGOGASTRODUODENOSCOPY (EGD) WITH BIOPSY (Mouth) ? ? ?  ?Patient location during evaluation: PACU ?Anesthesia Type: General ?Level of consciousness: awake and alert ?Pain management: pain level controlled ?Vital Signs Assessment: post-procedure vital signs reviewed and stable ?Respiratory status: spontaneous breathing, nonlabored ventilation, respiratory function stable and patient connected to nasal cannula oxygen ?Cardiovascular status: blood pressure returned to baseline and stable ?Postop Assessment: no apparent nausea or vomiting ?Anesthetic complications: no ? ? ?No notable events documented. ? ?Dim Meisinger ELAINE ? ? ? ? ? ?

## 2021-06-14 LAB — SURGICAL PATHOLOGY

## 2021-07-07 ENCOUNTER — Encounter: Payer: Self-pay | Admitting: Gastroenterology

## 2021-12-26 ENCOUNTER — Telehealth: Payer: Self-pay | Admitting: Family Medicine

## 2021-12-26 NOTE — Telephone Encounter (Signed)
Copied from [place] [id number]. Topic: Referral - Request for Referral >> Dec 26, 2021  9:22 AM Erskine Squibb wrote: Has patient seen PCP for this complaint? No. *If NO, is insurance requiring patient see PCP for this issue before PCP can refer them? Not sure as the patient is self pay at this time Referral for which specialty: Orthopedic  Preferred provider/office: Whatever is the soonest available Reason for referral: Left shoulder pain and lack of mobility  Please assist patient as soon as possible as she hurt her arm trying to lift her spouse who had passed out.

## 2021-12-27 ENCOUNTER — Ambulatory Visit: Payer: BC Managed Care – PPO | Admitting: Family Medicine

## 2021-12-27 ENCOUNTER — Encounter: Payer: Self-pay | Admitting: Family Medicine

## 2021-12-27 VITALS — BP 120/78 | HR 98 | Ht 67.0 in | Wt 176.0 lb

## 2021-12-27 DIAGNOSIS — M24812 Other specific joint derangements of left shoulder, not elsewhere classified: Secondary | ICD-10-CM | POA: Insufficient documentation

## 2021-12-27 DIAGNOSIS — M7542 Impingement syndrome of left shoulder: Secondary | ICD-10-CM

## 2021-12-27 DIAGNOSIS — M7522 Bicipital tendinitis, left shoulder: Secondary | ICD-10-CM

## 2021-12-27 MED ORDER — CYCLOBENZAPRINE HCL 10 MG PO TABS: 10.0000 mg | ORAL_TABLET | Freq: Every evening | ORAL | 1 refills | Status: DC | PRN

## 2021-12-27 MED ORDER — PREDNISONE 50 MG PO TABS: 50.0000 mg | ORAL_TABLET | Freq: Every day | ORAL | 0 refills | Status: DC

## 2021-12-27 MED ORDER — DICLOFENAC SODIUM 75 MG PO TBEC: 75.0000 mg | DELAYED_RELEASE_TABLET | Freq: Two times a day (BID) | ORAL | 1 refills | Status: DC

## 2021-12-27 NOTE — Patient Instructions (Addendum)
-   Start diclofenac twice daily with food (no other NSAIDs while on this medication) - Can dose cyclobenzaprine (muscle relaxer) nightly as needed for muscle tightness pain, side effect can be drowsiness - If symptoms persist without improvement by this weekend, start prednisone course x5 days while continuing the above - Can utilize ice, heat, and Tylenol for additional symptom control - Once symptoms allow gentle regular activity, start home exercises with information provided - Can continue work under light duty with restrictions - Return for follow-up January 2024, if still symptomatic during that time, obtain x-rays prior to visit - Contact us for any questions

## 2022-01-03 ENCOUNTER — Ambulatory Visit: Payer: BC Managed Care – PPO | Admitting: Family Medicine

## 2022-01-04 NOTE — Progress Notes (Signed)
     Primary Care / Sports Medicine Office Visit  Patient Information:  Patient ID: Ashley Silva, female DOB: 1975/03/18 Age: 46 y.o. MRN: [id number]   Ashley Silva is a pleasant 46 y.o. female presenting with the following:  Chief Complaint  Patient presents with   Shoulder Injury    Left-Picked husband off floor who passed out 2 weeks ago and has been in pain since. Thought it was a muscle. Tried lidocaine patch and cream, and tylenol with no relied. Numbness/ache    Vitals:   12/27/21 1053  BP: 120/78  Pulse: 98  SpO2: 99%   Vitals:   12/27/21 1053  Weight: 176 lb (79.8 kg)  Height: '5\' 7"'$  (1.702 m)   Body mass index is 27.57 kg/m.  No results found.   Independent interpretation of notes and tests performed by another provider:   None  Procedures performed:   None  Pertinent History, Exam, Impression, and Recommendations:   Problem List Items Addressed This Visit       Musculoskeletal and Integument   Biceps tendinitis, left - Primary   Relevant Medications   diclofenac (VOLTAREN) 75 MG EC tablet   predniSONE (DELTASONE) 50 MG tablet   cyclobenzaprine (FLEXERIL) 10 MG tablet   Other Relevant Orders   DG Shoulder Left   Rotator cuff impingement syndrome, left    Left shoulder pain following quickly assisting / lifting husband who had syncopal episode. Pain despite lidocaine patch and topical creams, noting numbness, ache.   Examination consistent with left biceps tendinitis and RC impingement, plan for medication  regimen, low threshold to advance to steroid regimen, and home rehab. Return for follow-up January 2024, if still symptomatic during that time, obtain x-rays prior to visit ordered.      Relevant Medications   diclofenac (VOLTAREN) 75 MG EC tablet   predniSONE (DELTASONE) 50 MG tablet   cyclobenzaprine (FLEXERIL) 10 MG tablet   Other Relevant Orders   DG Shoulder Left     Orders & Medications Meds ordered this encounter  Medications    diclofenac (VOLTAREN) 75 MG EC tablet    Sig: Take 1 tablet (75 mg total) by mouth 2 (two) times daily.    Dispense:  60 tablet    Refill:  1   predniSONE (DELTASONE) 50 MG tablet    Sig: Take 1 tablet (50 mg total) by mouth daily.    Dispense:  5 tablet    Refill:  0   cyclobenzaprine (FLEXERIL) 10 MG tablet    Sig: Take 1 tablet (10 mg total) by mouth at bedtime as needed for muscle spasms.    Dispense:  30 tablet    Refill:  1   Orders Placed This Encounter  Procedures   DG Shoulder Left     Return in about 5 weeks (around 01/30/2022) for f/u left shoulder.     Montel Culver, MD, [facility]   Primary Care Sports Medicine Primary Care and Sports Medicine at [facility]

## 2022-01-04 NOTE — Assessment & Plan Note (Addendum)
Left shoulder pain following quickly assisting / lifting husband who had syncopal episode. Pain despite lidocaine patch and topical creams, noting numbness, ache.   Examination consistent with left biceps tendinitis and RC impingement, plan for medication  regimen, low threshold to advance to steroid regimen, and home rehab. Return for follow-up January 2024, if still symptomatic during that time, obtain x-rays prior to visit ordered.

## 2022-01-04 NOTE — Assessment & Plan Note (Signed)
>>  ASSESSMENT AND PLAN FOR ROTATOR CUFF IMPINGEMENT SYNDROME, LEFT WRITTEN ON 01/04/2022  5:27 AM BY Marquette Piontek, Earley Abide, MD  Left shoulder pain following quickly assisting / lifting husband who had syncopal episode. Pain despite lidocaine patch and topical creams, noting numbness, ache.   Examination consistent with left biceps tendinitis and RC impingement, plan for medication  regimen, low threshold to advance to steroid regimen, and home rehab. Return for follow-up January 2024, if still symptomatic during that time, obtain x-rays prior to visit ordered.

## 2022-01-31 ENCOUNTER — Ambulatory Visit
Admission: RE | Admit: 2022-01-31 | Discharge: 2022-01-31 | Disposition: A | Discharge status: Home / Self Care | Payer: BC Managed Care – PPO | Source: Ambulatory Visit | Attending: Family Medicine | Admitting: Family Medicine

## 2022-01-31 ENCOUNTER — Ambulatory Visit
Admission: RE | Admit: 2022-01-31 | Discharge: 2022-01-31 | Disposition: A | Discharge status: Home / Self Care | Payer: BC Managed Care – PPO | Attending: Family Medicine | Admitting: Family Medicine

## 2022-01-31 DIAGNOSIS — M25712 Osteophyte, left shoulder: Secondary | ICD-10-CM | POA: Diagnosis not present

## 2022-01-31 DIAGNOSIS — M7522 Bicipital tendinitis, left shoulder: Secondary | ICD-10-CM | POA: Diagnosis not present

## 2022-01-31 DIAGNOSIS — S4992XA Unspecified injury of left shoulder and upper arm, initial encounter: Secondary | ICD-10-CM | POA: Diagnosis not present

## 2022-01-31 DIAGNOSIS — M7542 Impingement syndrome of left shoulder: Secondary | ICD-10-CM | POA: Diagnosis not present

## 2022-02-02 ENCOUNTER — Encounter: Payer: Self-pay | Admitting: Family Medicine

## 2022-02-02 ENCOUNTER — Ambulatory Visit (INDEPENDENT_AMBULATORY_CARE_PROVIDER_SITE_OTHER): Payer: BC Managed Care – PPO | Admitting: Family Medicine

## 2022-02-02 VITALS — BP 132/82 | HR 99 | Wt 181.0 lb

## 2022-02-02 DIAGNOSIS — M7542 Impingement syndrome of left shoulder: Secondary | ICD-10-CM | POA: Diagnosis not present

## 2022-02-02 DIAGNOSIS — M7522 Bicipital tendinitis, left shoulder: Secondary | ICD-10-CM | POA: Diagnosis not present

## 2022-02-02 NOTE — Assessment & Plan Note (Signed)
Patient returns for follow-up to left shoulder pain in the setting of concern for biceps tendinitis and rotator cuff involvement.  After last visit she has been compliant with prednisone course, diclofenac twice daily scheduled, and as needed nightly cyclobenzaprine.  She has been performing home exercises with benefit and, while improved, still symptomatic with daily tasks.  Examination shows interval improved painless range of motion, focal tenderness at the coracoid, bicipital groove, positive empty can, positive speeds, positive Yergason's.  Given patient's x-ray findings demonstrating ovoid calcified region in the proximity of the coracoid, concern for calcific biceps tendinitis serving as a nidus for her focal pain.  Plan for dedicated MRI left shoulder to further evaluate this and her persistent rotator cuff symptoms.  Over the interim I have advised continued scheduled diclofenac dosing, steady advance with home rehab, and continue as needed cyclobenzaprine.

## 2022-02-02 NOTE — Assessment & Plan Note (Addendum)
>>  ASSESSMENT AND PLAN FOR INTERNAL DERANGEMENT OF LEFT SHOULDER WRITTEN ON 02/24/2022 12:34 PM BY Tanishka Drolet, Earley Abide, MD  >>ASSESSMENT AND PLAN FOR BICEPS TENDINITIS, LEFT WRITTEN ON 02/02/2022  5:05 PM BY Berkleigh Beckles, Earley Abide, MD  Patient returns for follow-up to left shoulder pain in the setting of concern for biceps tendinitis and rotator cuff involvement.  After last visit she has been compliant with prednisone course, diclofenac twice daily scheduled, and as needed nightly cyclobenzaprine.  She has been performing home exercises with benefit and, while improved, still symptomatic with daily tasks.  Examination shows interval improved painless range of motion, focal tenderness at the coracoid, bicipital groove, positive empty can, positive speeds, positive Yergason's.  Given patient's x-ray findings demonstrating ovoid calcified region in the proximity of the coracoid, concern for calcific biceps tendinitis serving as a nidus for her focal pain.  Plan for dedicated MRI left shoulder to further evaluate this and her persistent rotator cuff symptoms.  Over the interim I have advised continued scheduled diclofenac dosing, steady advance with home rehab, and continue as needed cyclobenzaprine.  >>ASSESSMENT AND PLAN FOR ROTATOR CUFF IMPINGEMENT SYNDROME, LEFT WRITTEN ON 02/02/2022  5:06 PM BY Jamarquis Crull, Earley Abide, MD  See additional assessment(s) for plan details.  From medication management standpoint have advised continued diclofenac twice daily dosing until MRI results are available at which point we can determine next steps of both surgical and nonsurgical.

## 2022-02-02 NOTE — Progress Notes (Signed)
     Primary Care / Sports Medicine Office Visit  Patient Information:  Patient ID: Ashley Silva, female DOB: 10/03/1975 Age: 47 y.o. MRN: [id number]   Ashley Silva is a pleasant 47 y.o. female presenting with the following:  Chief Complaint  Patient presents with   Biceps tendinitis, left    Has had some relieve from pain with medication, started the steroids on 11/28.     Vitals:   02/02/22 1349  BP: 132/82  Pulse: 99  SpO2: 99%   Vitals:   02/02/22 1349  Weight: 181 lb (82.1 kg)   Body mass index is 28.35 kg/m.     Independent interpretation of notes and tests performed by another provider:   Independent interpretation of left shoulder x-rays dated 01/31/2022 reveals degenerative changes about the acromion with cortical roughening, subacromial spur, well-corticated chronic-appearing ovoid calcification noted near the coracoid  Procedures performed:   None  Pertinent History, Exam, Impression, and Recommendations:   Ashley Silva was seen today for biceps tendinitis, left.  Biceps tendinitis, left Assessment & Plan: Patient returns for follow-up to left shoulder pain in the setting of concern for biceps tendinitis and rotator cuff involvement.  After last visit she has been compliant with prednisone course, diclofenac twice daily scheduled, and as needed nightly cyclobenzaprine.  She has been performing home exercises with benefit and, while improved, still symptomatic with daily tasks.  Examination shows interval improved painless range of motion, focal tenderness at the coracoid, bicipital groove, positive empty can, positive speeds, positive Yergason's.  Given patient's x-ray findings demonstrating ovoid calcified region in the proximity of the coracoid, concern for calcific biceps tendinitis serving as a nidus for her focal pain.  Plan for dedicated MRI left shoulder to further evaluate this and her persistent rotator cuff symptoms.  Over the interim I have advised continued  scheduled diclofenac dosing, steady advance with home rehab, and continue as needed cyclobenzaprine.  Orders: -     MR SHOULDER LEFT WO CONTRAST; Future  Rotator cuff impingement syndrome, left Assessment & Plan: See additional assessment(s) for plan details.  From medication management standpoint have advised continued diclofenac twice daily dosing until MRI results are available at which point we can determine next steps of both surgical and nonsurgical.  Orders: -     MR SHOULDER LEFT WO CONTRAST; Future     Orders & Medications No orders of the defined types were placed in this encounter.  Orders Placed This Encounter  Procedures   MR Shoulder Left Wo Contrast     No follow-ups on file.     Montel Culver, MD, [facility]   Primary Care Sports Medicine Primary Care and Sports Medicine at [facility]

## 2022-02-02 NOTE — Assessment & Plan Note (Signed)
See additional assessment(s) for plan details.  From medication management standpoint have advised continued diclofenac twice daily dosing until MRI results are available at which point we can determine next steps of both surgical and nonsurgical.

## 2022-02-09 ENCOUNTER — Ambulatory Visit
Admission: RE | Admit: 2022-02-09 | Discharge: 2022-02-09 | Disposition: A | Discharge status: Home / Self Care | Payer: BC Managed Care – PPO | Source: Ambulatory Visit | Attending: Family Medicine | Admitting: Family Medicine

## 2022-02-09 DIAGNOSIS — M7542 Impingement syndrome of left shoulder: Secondary | ICD-10-CM | POA: Insufficient documentation

## 2022-02-09 DIAGNOSIS — R600 Localized edema: Secondary | ICD-10-CM | POA: Diagnosis not present

## 2022-02-09 DIAGNOSIS — M7522 Bicipital tendinitis, left shoulder: Secondary | ICD-10-CM | POA: Diagnosis not present

## 2022-02-09 DIAGNOSIS — M25512 Pain in left shoulder: Secondary | ICD-10-CM | POA: Diagnosis not present

## 2022-02-22 ENCOUNTER — Encounter: Payer: Self-pay | Admitting: Family Medicine

## 2022-02-22 NOTE — Telephone Encounter (Signed)
FYI

## 2022-02-24 ENCOUNTER — Encounter: Payer: Self-pay | Admitting: Family Medicine

## 2022-02-24 ENCOUNTER — Telehealth (INDEPENDENT_AMBULATORY_CARE_PROVIDER_SITE_OTHER): Payer: BC Managed Care – PPO | Admitting: Family Medicine

## 2022-02-24 DIAGNOSIS — M7522 Bicipital tendinitis, left shoulder: Secondary | ICD-10-CM | POA: Diagnosis not present

## 2022-02-24 DIAGNOSIS — M24812 Other specific joint derangements of left shoulder, not elsewhere classified: Secondary | ICD-10-CM

## 2022-02-24 DIAGNOSIS — M7542 Impingement syndrome of left shoulder: Secondary | ICD-10-CM

## 2022-02-24 MED ORDER — DICLOFENAC SODIUM 75 MG PO TBEC: 75.0000 mg | DELAYED_RELEASE_TABLET | Freq: Two times a day (BID) | ORAL | 1 refills | Status: DC

## 2022-02-24 NOTE — Progress Notes (Signed)
Primary Care / Sports Medicine Virtual Visit  Patient Information:  Patient ID: Ashley Silva, female DOB: April 05, 1975 Age: 47 y.o. MRN: [id number]   Ashley Silva is a pleasant 47 y.o. female presenting with the following:  Chief Complaint  Patient presents with   MRI results    Review of Systems: No fevers, chills, night sweats, weight loss, chest pain, or shortness of breath.   Patient Active Problem List   Diagnosis Date Noted   Internal derangement of left shoulder 12/27/2021   Polyp of ascending colon    Menorrhagia with regular cycle 09/30/2020   Adenomyosis 09/30/2020   Endometrial polyp 09/30/2020   Chronic blood loss anemia 09/30/2020   Seborrheic keratoses 06/16/2019   Past Medical History:  Diagnosis Date   Hypoglycemia    Outpatient Encounter Medications as of 02/24/2022  Medication Sig Note   cyclobenzaprine (FLEXERIL) 10 MG tablet Take 1 tablet (10 mg total) by mouth at bedtime as needed for muscle spasms.    diclofenac (VOLTAREN) 75 MG EC tablet Take 1 tablet (75 mg total) by mouth 2 (two) times daily.    Probiotic Product (PROBIOTIC MULTI-ENZYME) TABS Take 1 tablet by mouth daily. 09/16/2020: On hold for procedure    [DISCONTINUED] albuterol (PROVENTIL HFA;VENTOLIN HFA) 108 (90 Base) MCG/ACT inhaler Inhale 2 puffs into the lungs every 6 (six) hours as needed for wheezing or shortness of breath.    [DISCONTINUED] diclofenac (VOLTAREN) 75 MG EC tablet Take 1 tablet (75 mg total) by mouth 2 (two) times daily.    [DISCONTINUED] ibuprofen (ADVIL) 600 MG tablet Take 1 tablet (600 mg total) by mouth every 6 (six) hours.    No facility-administered encounter medications on file as of 02/24/2022.   Past Surgical History:  Procedure Laterality Date   CESAREAN SECTION     1   COLONOSCOPY N/A 06/10/2021   Procedure: COLONOSCOPY WITH BIOPSY;  Surgeon: Lucilla Lame, MD;  Location: [place];  Service: Endoscopy;  Laterality: N/A;   CYSTOSCOPY N/A 09/30/2020    Procedure: CYSTOSCOPY;  Surgeon: Will Bonnet, MD;  Location: [facility];  Service: Gynecology;  Laterality: N/A;   ESOPHAGOGASTRODUODENOSCOPY N/A 06/10/2021   Procedure: ESOPHAGOGASTRODUODENOSCOPY (EGD) WITH BIOPSY;  Surgeon: Lucilla Lame, MD;  Location: [place];  Service: Endoscopy;  Laterality: N/A;   POLYPECTOMY N/A 06/10/2021   Procedure: POLYPECTOMY;  Surgeon: Lucilla Lame, MD;  Location: [place];  Service: Endoscopy;  Laterality: N/A;   ROBOTIC ASSISTED LAPAROSCOPIC HYSTERECTOMY AND SALPINGECTOMY Bilateral 09/30/2020   Procedure: XI ROBOTIC ASSISTED LAPAROSCOPIC HYSTERECTOMY AND SALPINGECTOMY;  Surgeon: Will Bonnet, MD;  Location: [facility];  Service: Gynecology;  Laterality: Bilateral;    Virtual Visit via MyChart Video:   I connected with Ashley Silva on 02/24/22 via MyChart Video and verified that I am speaking with the correct person using appropriate identifiers.   The limitations, risks, security and privacy concerns of performing an evaluation and management service by MyChart Video, including the higher likelihood of inaccurate diagnoses and treatments, and the availability of in person appointments were reviewed. The possible need of an additional face-to-face encounter for complete and high quality delivery of care was discussed. The patient was also made aware that there may be a patient responsible charge related to this service. The patient expressed understanding and wishes to proceed.  Provider location is in medical facility. Patient location is at their work, different from provider location. People involved in care of the patient during this telehealth encounter were myself,  my nurse/medical assistant, and my front office/scheduling team member.  Objective findings:   General: Speaking full sentences, no audible heavy breathing. Sounds alert and appropriately interactive. Well-appearing. Face symmetric. Extraocular movements intact. Pupils  equal and round. No nasal flaring or accessory muscle use visualized.  Independent interpretation of notes and tests performed by another provider:   None  Pertinent History, Exam, Impression, and Recommendations:   Internal derangement of left shoulder Patient cites interval stability with left shoulder, still symptomatic with overhead activities, has aches and pains anteriorly. Has been dosing diclofenac nightly PRN. We reviewed MRI results at length and 2 primary areas of concern (humeral head cartilage defect) and RC tendinosis. She does have apparent loose body which may be playing a role in her symptoms.  We reviewed both surgical and non-surgical options, patient opted to transition to scheduled diclofenac twice daily, start home strengthening exercises (has been doing ROM exercises already), and have 1 month follow-up with low threshold to advance to intra-articular and/or subacromial corticosteroid injection(s) as clinically guided. Has deferred formal PT at this time.  Orders & Medications Meds ordered this encounter  Medications   diclofenac (VOLTAREN) 75 MG EC tablet    Sig: Take 1 tablet (75 mg total) by mouth 2 (two) times daily.    Dispense:  60 tablet    Refill:  1   No orders of the defined types were placed in this encounter.    I discussed the above assessment and treatment plan with the patient. The patient was provided an opportunity to ask questions and all were answered. The patient agreed with the plan and demonstrated an understanding of the instructions.   The patient was advised to call back or seek an in-person evaluation if the symptoms worsen or if the condition fails to improve as anticipated.   I provided a total time of 30 minutes including both face-to-face and non-face-to-face time on 02/24/2022 inclusive of time utilized for medical chart review, information gathering, care coordination with staff, and documentation completion.    Montel Culver,  MD, [facility]   Primary Care Sports Medicine Primary Care and Sports Medicine at [facility]

## 2022-02-24 NOTE — Assessment & Plan Note (Signed)
Patient cites interval stability with left shoulder, still symptomatic with overhead activities, has aches and pains anteriorly. Has been dosing diclofenac nightly PRN. We reviewed MRI results at length and 2 primary areas of concern (humeral head cartilage defect) and RC tendinosis. She does have apparent loose body which may be playing a role in her symptoms.  We reviewed both surgical and non-surgical options, patient opted to transition to scheduled diclofenac twice daily, start home strengthening exercises (has been doing ROM exercises already), and have 1 month follow-up with low threshold to advance to intra-articular and/or subacromial corticosteroid injection(s) as clinically guided. Has deferred formal PT at this time.

## 2022-02-24 NOTE — Patient Instructions (Signed)
-  Increase diclofenac to twice daily with food until return visit - Start the strengthening portion of the home exercises and continue working your range of motion exercises - Can use ice and Tylenol for additional pain control if needed - Return in 1 month, contact for questions between now and then

## 2022-03-28 ENCOUNTER — Encounter: Payer: Self-pay | Admitting: Family Medicine

## 2022-03-28 ENCOUNTER — Ambulatory Visit (INDEPENDENT_AMBULATORY_CARE_PROVIDER_SITE_OTHER): Payer: BC Managed Care – PPO | Admitting: Family Medicine

## 2022-03-28 DIAGNOSIS — M24812 Other specific joint derangements of left shoulder, not elsewhere classified: Secondary | ICD-10-CM

## 2022-03-28 MED ORDER — DICLOFENAC SODIUM 75 MG PO TBEC: 75.0000 mg | DELAYED_RELEASE_TABLET | Freq: Two times a day (BID) | ORAL | 0 refills | Status: DC | PRN

## 2022-03-31 NOTE — Assessment & Plan Note (Signed)
Excellent interval progress with diclofenac, home-based rehab.  Examination today reveals primary focality to the biceps tendon, equivocal O'Brien's.  Rotator cuff findings essentially benign.  Given her excellent interval progress we discussed treatment strategies and she will transition to as needed dosing of diclofenac, continue to further advance home-based rehab, and follow-up as needed.

## 2022-03-31 NOTE — Progress Notes (Signed)
     Primary Care / Sports Medicine Office Visit  Patient Information:  Patient ID: Ashley Silva, female DOB: 1975/07/20 Age: 47 y.o. MRN: [id number]   Ashley Silva is a pleasant 47 y.o. female presenting with the following:  Chief Complaint  Patient presents with   Shoulder Pain    Left shoulder not having pain in shoulder at this time, is a lot better    Vitals:   03/28/22 1540 03/28/22 1544  BP: (!) 150/90 (!) 150/90  Pulse: 97   SpO2: 97%    Vitals:   03/28/22 1540  Weight: 182 lb (82.6 kg)  Height: '5\' 7"'$  (1.702 m)   Body mass index is 28.51 kg/m.  No results found.   Independent interpretation of notes and tests performed by another provider:   None  Procedures performed:   None  Pertinent History, Exam, Impression, and Recommendations:   Ashley Silva was seen today for shoulder pain.  Internal derangement of left shoulder Assessment & Plan: Excellent interval progress with diclofenac, home-based rehab.  Examination today reveals primary focality to the biceps tendon, equivocal O'Brien's.  Rotator cuff findings essentially benign.  Given her excellent interval progress we discussed treatment strategies and she will transition to as needed dosing of diclofenac, continue to further advance home-based rehab, and follow-up as needed.  Orders: -     Diclofenac Sodium; Take 1 tablet (75 mg total) by mouth 2 (two) times daily as needed.  Dispense: 60 tablet; Refill: 0     Orders & Medications Meds ordered this encounter  Medications   diclofenac (VOLTAREN) 75 MG EC tablet    Sig: Take 1 tablet (75 mg total) by mouth 2 (two) times daily as needed.    Dispense:  60 tablet    Refill:  0   No orders of the defined types were placed in this encounter.    Return if symptoms worsen or fail to improve.     Montel Culver, MD, [facility]   Primary Care Sports Medicine Primary Care and Sports Medicine at [facility]

## 2022-09-02 ENCOUNTER — Encounter: Payer: Self-pay | Admitting: Emergency Medicine

## 2022-09-02 ENCOUNTER — Ambulatory Visit
Admission: EM | Admit: 2022-09-02 | Discharge: 2022-09-02 | Disposition: A | Discharge status: Home / Self Care | Payer: BC Managed Care – PPO | Attending: Physician Assistant | Admitting: Physician Assistant

## 2022-09-02 DIAGNOSIS — J029 Acute pharyngitis, unspecified: Secondary | ICD-10-CM

## 2022-09-02 DIAGNOSIS — U071 COVID-19: Secondary | ICD-10-CM | POA: Diagnosis not present

## 2022-09-02 LAB — GROUP A STREP BY PCR: Group A Strep by PCR: NOT DETECTED

## 2022-09-02 NOTE — ED Provider Notes (Signed)
[facility]    CSN: [id number] Arrival date & time: 09/02/22  5621      History   Chief Complaint Chief Complaint  Patient presents with   Covid Positive    Home test   Sore Throat    HPI Ashley Silva is a 47 y.o. female who presents with onset of cough, rhinitis and ST x 4 days. She took a Covid test and was positive 3 days ago. She felt feverish and had a HA the first 2 days, but she is feeling better except for the ST and wants to make sure she does not have strep. She has been fatigued and been in bed. Denies SOB, CP.     Past Medical History:  Diagnosis Date   Hypoglycemia     Patient Active Problem List   Diagnosis Date Noted   Internal derangement of left shoulder 12/27/2021   Polyp of ascending colon    Menorrhagia with regular cycle 09/30/2020   Adenomyosis 09/30/2020   Endometrial polyp 09/30/2020   Chronic blood loss anemia 09/30/2020   Seborrheic keratoses 06/16/2019    Past Surgical History:  Procedure Laterality Date   CESAREAN SECTION     1   COLONOSCOPY N/A 06/10/2021   Procedure: COLONOSCOPY WITH BIOPSY;  Surgeon: Midge Minium, MD;  Location: [facility];  Service: Endoscopy;  Laterality: N/A;   CYSTOSCOPY N/A 09/30/2020   Procedure: CYSTOSCOPY;  Surgeon: Conard Novak, MD;  Location: [facility];  Service: Gynecology;  Laterality: N/A;   ESOPHAGOGASTRODUODENOSCOPY N/A 06/10/2021   Procedure: ESOPHAGOGASTRODUODENOSCOPY (EGD) WITH BIOPSY;  Surgeon: Midge Minium, MD;  Location: [facility];  Service: Endoscopy;  Laterality: N/A;   POLYPECTOMY N/A 06/10/2021   Procedure: POLYPECTOMY;  Surgeon: Midge Minium, MD;  Location: [facility];  Service: Endoscopy;  Laterality: N/A;   ROBOTIC ASSISTED LAPAROSCOPIC HYSTERECTOMY AND SALPINGECTOMY Bilateral 09/30/2020   Procedure: XI ROBOTIC ASSISTED LAPAROSCOPIC HYSTERECTOMY AND SALPINGECTOMY;  Surgeon: Conard Novak, MD;  Location: [facility];  Service: Gynecology;  Laterality:  Bilateral;    OB History     Gravida  2   Para  2   Term  2   Preterm      AB      Living  2      SAB      IAB      Ectopic      Multiple      Live Births               Home Medications    Prior to Admission medications   Medication Sig Start Date End Date Taking? Authorizing Provider  albuterol (PROVENTIL HFA;VENTOLIN HFA) 108 (90 Base) MCG/ACT inhaler Inhale 2 puffs into the lungs every 6 (six) hours as needed for wheezing or shortness of breath. 10/26/17 01/07/19  Rockne Menghini, MD    Family History Family History  Problem Relation Age of Onset   Hypertension Mother    Other Father        unknown medical history   Heart disease Maternal Grandmother    Stroke Maternal Grandfather    Breast cancer Neg Hx     Social History Social History   Tobacco Use   Smoking status: Former    Current packs/day: 0.00    Types: Cigarettes    Start date: 01/23/1991    Quit date: 01/23/1996    Years since quitting: 26.6   Smokeless tobacco: Never  Vaping Use   Vaping status: Never Used  Substance Use Topics   Alcohol use: Yes    Comment: occasionally   Drug use: Never     Allergies   Patient has no known allergies.   Review of Systems Review of Systems As noted in HPI  Physical Exam Triage Vital Signs ED Triage Vitals  Encounter Vitals Group     BP 09/02/22 0906 (!) 164/107     Systolic BP Percentile --      Diastolic BP Percentile --      Pulse Rate 09/02/22 0906 80     Resp 09/02/22 0906 14     Temp 09/02/22 0906 98.2 F (36.8 C)     Temp Source 09/02/22 0906 Oral     SpO2 09/02/22 0906 98 %     Weight 09/02/22 0903 182 lb 1.6 oz (82.6 kg)     Height 09/02/22 0903 5\' 7"  (1.702 m)     Head Circumference --      Peak Flow --      Pain Score 09/02/22 0903 6     Pain Loc --      Pain Education --      Exclude from Growth Chart --    No data found.  Updated Vital Signs BP (!) 163/92 (BP Location: Left Arm)   Pulse 80   Temp  98.2 F (36.8 C) (Oral)   Resp 14   Ht 5\' 7"  (1.702 m)   Wt 182 lb 1.6 oz (82.6 kg)   LMP 07/10/2020 (Exact Date)   SpO2 98%   BMI 28.52 kg/m   Visual Acuity Right Eye Distance:   Left Eye Distance:   Bilateral Distance:    Right Eye Near:   Left Eye Near:    Bilateral Near:     Physical Exam Physical Exam Vitals signs and nursing note reviewed.  Constitutional:      General: She is not in acute distress.    Appearance: Normal appearance. She is not ill-appearing, toxic-appearing or diaphoretic.  HENT:     Head: Normocephalic.     Right Ear: Tympanic membrane, ear canal and external ear normal.     Left Ear: Tympanic membrane, ear canal and external ear normal.     Nose: Nose normal.     Mouth/Throat: with mild erythema    Mouth: Mucous membranes are moist.  Eyes:     General: No scleral icterus.       Right eye: No discharge.        Left eye: No discharge.     Conjunctiva/sclera: Conjunctivae normal.  Neck:     Musculoskeletal: Neck supple. No neck rigidity.  Cardiovascular:     Rate and Rhythm: Normal rate and regular rhythm.     Heart sounds: No murmur.  Pulmonary:     Effort: Pulmonary effort is normal.     Breath sounds: Normal breath sounds.  Musculoskeletal: Normal range of motion.  Lymphadenopathy:     Cervical: No cervical adenopathy.  Skin:    General: Skin is warm and dry.     Coloration: Skin is not jaundiced.     Findings: No rash.  Neurological:     Mental Status: She is alert and oriented to person, place, and time.     Gait: Gait normal.  Psychiatric:        Mood and Affect: Mood normal.        Behavior: Behavior normal.        Thought Content: Thought content normal.  Judgment: Judgment normal.    UC Treatments / Results  Labs (all labs ordered are listed, but only abnormal results are displayed) Labs Reviewed  GROUP A STREP BY PCR  PCR strep is negative  EKG   Radiology No results found.  Procedures Procedures  (including critical care time)  Medications Ordered in UC Medications - No data to display  Initial Impression / Assessment and Plan / UC Course  I have reviewed the triage vital signs and the nursing notes.  Pertinent labs  results that were available during my care of the patient were reviewed by me and considered in my medical decision making (see chart for details).   Covid infection Viral Pharyngitis  She was advised to try throat pain sprays for pain as directed per bottle. See instructions.  Final Clinical Impressions(s) / UC Diagnoses   Final diagnoses:  COVID-19 virus infection  Viral pharyngitis     Discharge Instructions      I will call you when your strep test is back  Starting Monday, you may be in public but wear a mask for 5 days      ED Prescriptions   None    PDMP not reviewed this encounter.   Garey Ham, [place] 09/02/22 [phone]

## 2022-09-02 NOTE — ED Triage Notes (Signed)
Patient c/o cough, runny nose, and sore throat that started on Tuesday.  Patient states that she took a home covid test on Wed and was positive.  Patient denies fevers.

## 2022-09-02 NOTE — Discharge Instructions (Signed)
I will call you when your strep test is back  Starting Monday, you may be in public but wear a mask for 5 days

## 2022-11-07 DIAGNOSIS — M25511 Pain in right shoulder: Secondary | ICD-10-CM | POA: Diagnosis not present

## 2022-11-07 DIAGNOSIS — M25512 Pain in left shoulder: Secondary | ICD-10-CM | POA: Diagnosis not present

## 2022-11-07 DIAGNOSIS — M9901 Segmental and somatic dysfunction of cervical region: Secondary | ICD-10-CM | POA: Diagnosis not present

## 2022-11-07 DIAGNOSIS — M9902 Segmental and somatic dysfunction of thoracic region: Secondary | ICD-10-CM | POA: Diagnosis not present

## 2022-11-09 DIAGNOSIS — M9901 Segmental and somatic dysfunction of cervical region: Secondary | ICD-10-CM | POA: Diagnosis not present

## 2022-11-09 DIAGNOSIS — M25511 Pain in right shoulder: Secondary | ICD-10-CM | POA: Diagnosis not present

## 2022-11-09 DIAGNOSIS — M9902 Segmental and somatic dysfunction of thoracic region: Secondary | ICD-10-CM | POA: Diagnosis not present

## 2022-11-09 DIAGNOSIS — M25512 Pain in left shoulder: Secondary | ICD-10-CM | POA: Diagnosis not present

## 2022-11-13 DIAGNOSIS — M25511 Pain in right shoulder: Secondary | ICD-10-CM | POA: Diagnosis not present

## 2022-11-13 DIAGNOSIS — M9902 Segmental and somatic dysfunction of thoracic region: Secondary | ICD-10-CM | POA: Diagnosis not present

## 2022-11-13 DIAGNOSIS — M9901 Segmental and somatic dysfunction of cervical region: Secondary | ICD-10-CM | POA: Diagnosis not present

## 2022-11-13 DIAGNOSIS — M25512 Pain in left shoulder: Secondary | ICD-10-CM | POA: Diagnosis not present

## 2022-11-15 DIAGNOSIS — M25512 Pain in left shoulder: Secondary | ICD-10-CM | POA: Diagnosis not present

## 2022-11-15 DIAGNOSIS — M9902 Segmental and somatic dysfunction of thoracic region: Secondary | ICD-10-CM | POA: Diagnosis not present

## 2022-11-15 DIAGNOSIS — M25511 Pain in right shoulder: Secondary | ICD-10-CM | POA: Diagnosis not present

## 2022-11-15 DIAGNOSIS — M9901 Segmental and somatic dysfunction of cervical region: Secondary | ICD-10-CM | POA: Diagnosis not present

## 2022-11-16 DIAGNOSIS — M25512 Pain in left shoulder: Secondary | ICD-10-CM | POA: Diagnosis not present

## 2022-11-16 DIAGNOSIS — M9901 Segmental and somatic dysfunction of cervical region: Secondary | ICD-10-CM | POA: Diagnosis not present

## 2022-11-16 DIAGNOSIS — M25511 Pain in right shoulder: Secondary | ICD-10-CM | POA: Diagnosis not present

## 2022-11-16 DIAGNOSIS — M9902 Segmental and somatic dysfunction of thoracic region: Secondary | ICD-10-CM | POA: Diagnosis not present

## 2022-11-20 DIAGNOSIS — M9901 Segmental and somatic dysfunction of cervical region: Secondary | ICD-10-CM | POA: Diagnosis not present

## 2022-11-20 DIAGNOSIS — M25512 Pain in left shoulder: Secondary | ICD-10-CM | POA: Diagnosis not present

## 2022-11-20 DIAGNOSIS — M25511 Pain in right shoulder: Secondary | ICD-10-CM | POA: Diagnosis not present

## 2022-11-20 DIAGNOSIS — M9902 Segmental and somatic dysfunction of thoracic region: Secondary | ICD-10-CM | POA: Diagnosis not present

## 2022-11-22 DIAGNOSIS — M25512 Pain in left shoulder: Secondary | ICD-10-CM | POA: Diagnosis not present

## 2022-11-22 DIAGNOSIS — M25511 Pain in right shoulder: Secondary | ICD-10-CM | POA: Diagnosis not present

## 2022-11-22 DIAGNOSIS — M9901 Segmental and somatic dysfunction of cervical region: Secondary | ICD-10-CM | POA: Diagnosis not present

## 2022-11-22 DIAGNOSIS — M9902 Segmental and somatic dysfunction of thoracic region: Secondary | ICD-10-CM | POA: Diagnosis not present

## 2022-11-23 DIAGNOSIS — M25512 Pain in left shoulder: Secondary | ICD-10-CM | POA: Diagnosis not present

## 2022-11-23 DIAGNOSIS — M25511 Pain in right shoulder: Secondary | ICD-10-CM | POA: Diagnosis not present

## 2022-11-23 DIAGNOSIS — M9902 Segmental and somatic dysfunction of thoracic region: Secondary | ICD-10-CM | POA: Diagnosis not present

## 2022-11-23 DIAGNOSIS — M9901 Segmental and somatic dysfunction of cervical region: Secondary | ICD-10-CM | POA: Diagnosis not present

## 2022-11-27 DIAGNOSIS — M9902 Segmental and somatic dysfunction of thoracic region: Secondary | ICD-10-CM | POA: Diagnosis not present

## 2022-11-27 DIAGNOSIS — M9901 Segmental and somatic dysfunction of cervical region: Secondary | ICD-10-CM | POA: Diagnosis not present

## 2022-11-27 DIAGNOSIS — M25511 Pain in right shoulder: Secondary | ICD-10-CM | POA: Diagnosis not present

## 2022-11-27 DIAGNOSIS — M25512 Pain in left shoulder: Secondary | ICD-10-CM | POA: Diagnosis not present

## 2022-11-29 DIAGNOSIS — M9901 Segmental and somatic dysfunction of cervical region: Secondary | ICD-10-CM | POA: Diagnosis not present

## 2022-11-29 DIAGNOSIS — M9902 Segmental and somatic dysfunction of thoracic region: Secondary | ICD-10-CM | POA: Diagnosis not present

## 2022-11-29 DIAGNOSIS — M25511 Pain in right shoulder: Secondary | ICD-10-CM | POA: Diagnosis not present

## 2022-11-29 DIAGNOSIS — M25512 Pain in left shoulder: Secondary | ICD-10-CM | POA: Diagnosis not present

## 2022-11-30 DIAGNOSIS — M25512 Pain in left shoulder: Secondary | ICD-10-CM | POA: Diagnosis not present

## 2022-11-30 DIAGNOSIS — M25511 Pain in right shoulder: Secondary | ICD-10-CM | POA: Diagnosis not present

## 2022-11-30 DIAGNOSIS — M9901 Segmental and somatic dysfunction of cervical region: Secondary | ICD-10-CM | POA: Diagnosis not present

## 2022-11-30 DIAGNOSIS — M9902 Segmental and somatic dysfunction of thoracic region: Secondary | ICD-10-CM | POA: Diagnosis not present

## 2022-12-04 DIAGNOSIS — M9901 Segmental and somatic dysfunction of cervical region: Secondary | ICD-10-CM | POA: Diagnosis not present

## 2022-12-04 DIAGNOSIS — M9902 Segmental and somatic dysfunction of thoracic region: Secondary | ICD-10-CM | POA: Diagnosis not present

## 2022-12-04 DIAGNOSIS — M25512 Pain in left shoulder: Secondary | ICD-10-CM | POA: Diagnosis not present

## 2022-12-04 DIAGNOSIS — M25511 Pain in right shoulder: Secondary | ICD-10-CM | POA: Diagnosis not present

## 2022-12-06 DIAGNOSIS — M25511 Pain in right shoulder: Secondary | ICD-10-CM | POA: Diagnosis not present

## 2022-12-06 DIAGNOSIS — M25512 Pain in left shoulder: Secondary | ICD-10-CM | POA: Diagnosis not present

## 2022-12-06 DIAGNOSIS — M9901 Segmental and somatic dysfunction of cervical region: Secondary | ICD-10-CM | POA: Diagnosis not present

## 2022-12-06 DIAGNOSIS — M9902 Segmental and somatic dysfunction of thoracic region: Secondary | ICD-10-CM | POA: Diagnosis not present

## 2022-12-07 DIAGNOSIS — M9901 Segmental and somatic dysfunction of cervical region: Secondary | ICD-10-CM | POA: Diagnosis not present

## 2022-12-07 DIAGNOSIS — M25512 Pain in left shoulder: Secondary | ICD-10-CM | POA: Diagnosis not present

## 2022-12-07 DIAGNOSIS — M9902 Segmental and somatic dysfunction of thoracic region: Secondary | ICD-10-CM | POA: Diagnosis not present

## 2022-12-07 DIAGNOSIS — M25511 Pain in right shoulder: Secondary | ICD-10-CM | POA: Diagnosis not present

## 2022-12-11 DIAGNOSIS — M9901 Segmental and somatic dysfunction of cervical region: Secondary | ICD-10-CM | POA: Diagnosis not present

## 2022-12-11 DIAGNOSIS — M9902 Segmental and somatic dysfunction of thoracic region: Secondary | ICD-10-CM | POA: Diagnosis not present

## 2022-12-11 DIAGNOSIS — M25512 Pain in left shoulder: Secondary | ICD-10-CM | POA: Diagnosis not present

## 2022-12-11 DIAGNOSIS — M25511 Pain in right shoulder: Secondary | ICD-10-CM | POA: Diagnosis not present

## 2022-12-13 DIAGNOSIS — M25512 Pain in left shoulder: Secondary | ICD-10-CM | POA: Diagnosis not present

## 2022-12-13 DIAGNOSIS — M9902 Segmental and somatic dysfunction of thoracic region: Secondary | ICD-10-CM | POA: Diagnosis not present

## 2022-12-13 DIAGNOSIS — M25511 Pain in right shoulder: Secondary | ICD-10-CM | POA: Diagnosis not present

## 2022-12-13 DIAGNOSIS — M9901 Segmental and somatic dysfunction of cervical region: Secondary | ICD-10-CM | POA: Diagnosis not present

## 2022-12-18 DIAGNOSIS — M25511 Pain in right shoulder: Secondary | ICD-10-CM | POA: Diagnosis not present

## 2022-12-18 DIAGNOSIS — M9901 Segmental and somatic dysfunction of cervical region: Secondary | ICD-10-CM | POA: Diagnosis not present

## 2022-12-18 DIAGNOSIS — M9902 Segmental and somatic dysfunction of thoracic region: Secondary | ICD-10-CM | POA: Diagnosis not present

## 2022-12-18 DIAGNOSIS — M25512 Pain in left shoulder: Secondary | ICD-10-CM | POA: Diagnosis not present

## 2022-12-20 DIAGNOSIS — M9901 Segmental and somatic dysfunction of cervical region: Secondary | ICD-10-CM | POA: Diagnosis not present

## 2022-12-20 DIAGNOSIS — M25511 Pain in right shoulder: Secondary | ICD-10-CM | POA: Diagnosis not present

## 2022-12-20 DIAGNOSIS — M9902 Segmental and somatic dysfunction of thoracic region: Secondary | ICD-10-CM | POA: Diagnosis not present

## 2022-12-20 DIAGNOSIS — M25512 Pain in left shoulder: Secondary | ICD-10-CM | POA: Diagnosis not present

## 2022-12-27 DIAGNOSIS — M9902 Segmental and somatic dysfunction of thoracic region: Secondary | ICD-10-CM | POA: Diagnosis not present

## 2022-12-27 DIAGNOSIS — M25511 Pain in right shoulder: Secondary | ICD-10-CM | POA: Diagnosis not present

## 2022-12-27 DIAGNOSIS — M25512 Pain in left shoulder: Secondary | ICD-10-CM | POA: Diagnosis not present

## 2022-12-27 DIAGNOSIS — M9901 Segmental and somatic dysfunction of cervical region: Secondary | ICD-10-CM | POA: Diagnosis not present

## 2023-01-01 DIAGNOSIS — M9902 Segmental and somatic dysfunction of thoracic region: Secondary | ICD-10-CM | POA: Diagnosis not present

## 2023-01-01 DIAGNOSIS — M9901 Segmental and somatic dysfunction of cervical region: Secondary | ICD-10-CM | POA: Diagnosis not present

## 2023-01-01 DIAGNOSIS — M25512 Pain in left shoulder: Secondary | ICD-10-CM | POA: Diagnosis not present

## 2023-01-01 DIAGNOSIS — M25511 Pain in right shoulder: Secondary | ICD-10-CM | POA: Diagnosis not present

## 2023-01-22 DIAGNOSIS — M25512 Pain in left shoulder: Secondary | ICD-10-CM | POA: Diagnosis not present

## 2023-01-22 DIAGNOSIS — M9901 Segmental and somatic dysfunction of cervical region: Secondary | ICD-10-CM | POA: Diagnosis not present

## 2023-01-22 DIAGNOSIS — M9902 Segmental and somatic dysfunction of thoracic region: Secondary | ICD-10-CM | POA: Diagnosis not present

## 2023-01-22 DIAGNOSIS — M25511 Pain in right shoulder: Secondary | ICD-10-CM | POA: Diagnosis not present

## 2023-01-25 DIAGNOSIS — M25511 Pain in right shoulder: Secondary | ICD-10-CM | POA: Diagnosis not present

## 2023-01-25 DIAGNOSIS — M9901 Segmental and somatic dysfunction of cervical region: Secondary | ICD-10-CM | POA: Diagnosis not present

## 2023-01-25 DIAGNOSIS — M25512 Pain in left shoulder: Secondary | ICD-10-CM | POA: Diagnosis not present

## 2023-01-25 DIAGNOSIS — M9902 Segmental and somatic dysfunction of thoracic region: Secondary | ICD-10-CM | POA: Diagnosis not present

## 2023-01-29 DIAGNOSIS — M9901 Segmental and somatic dysfunction of cervical region: Secondary | ICD-10-CM | POA: Diagnosis not present

## 2023-01-29 DIAGNOSIS — M25511 Pain in right shoulder: Secondary | ICD-10-CM | POA: Diagnosis not present

## 2023-01-29 DIAGNOSIS — M9902 Segmental and somatic dysfunction of thoracic region: Secondary | ICD-10-CM | POA: Diagnosis not present

## 2023-01-29 DIAGNOSIS — M25512 Pain in left shoulder: Secondary | ICD-10-CM | POA: Diagnosis not present

## 2023-02-01 DIAGNOSIS — M9902 Segmental and somatic dysfunction of thoracic region: Secondary | ICD-10-CM | POA: Diagnosis not present

## 2023-02-01 DIAGNOSIS — M25512 Pain in left shoulder: Secondary | ICD-10-CM | POA: Diagnosis not present

## 2023-02-01 DIAGNOSIS — M25511 Pain in right shoulder: Secondary | ICD-10-CM | POA: Diagnosis not present

## 2023-02-01 DIAGNOSIS — M9901 Segmental and somatic dysfunction of cervical region: Secondary | ICD-10-CM | POA: Diagnosis not present

## 2023-02-05 DIAGNOSIS — M9901 Segmental and somatic dysfunction of cervical region: Secondary | ICD-10-CM | POA: Diagnosis not present

## 2023-02-05 DIAGNOSIS — M25512 Pain in left shoulder: Secondary | ICD-10-CM | POA: Diagnosis not present

## 2023-02-05 DIAGNOSIS — M25511 Pain in right shoulder: Secondary | ICD-10-CM | POA: Diagnosis not present

## 2023-02-05 DIAGNOSIS — M9902 Segmental and somatic dysfunction of thoracic region: Secondary | ICD-10-CM | POA: Diagnosis not present

## 2023-02-12 DIAGNOSIS — M25512 Pain in left shoulder: Secondary | ICD-10-CM | POA: Diagnosis not present

## 2023-02-12 DIAGNOSIS — M9901 Segmental and somatic dysfunction of cervical region: Secondary | ICD-10-CM | POA: Diagnosis not present

## 2023-02-12 DIAGNOSIS — M9902 Segmental and somatic dysfunction of thoracic region: Secondary | ICD-10-CM | POA: Diagnosis not present

## 2023-02-12 DIAGNOSIS — M25511 Pain in right shoulder: Secondary | ICD-10-CM | POA: Diagnosis not present

## 2023-02-19 DIAGNOSIS — M9902 Segmental and somatic dysfunction of thoracic region: Secondary | ICD-10-CM | POA: Diagnosis not present

## 2023-02-19 DIAGNOSIS — M25512 Pain in left shoulder: Secondary | ICD-10-CM | POA: Diagnosis not present

## 2023-02-19 DIAGNOSIS — M25511 Pain in right shoulder: Secondary | ICD-10-CM | POA: Diagnosis not present

## 2023-02-19 DIAGNOSIS — M9901 Segmental and somatic dysfunction of cervical region: Secondary | ICD-10-CM | POA: Diagnosis not present

## 2023-06-20 IMAGING — MG MM DIGITAL DIAGNOSTIC UNILAT*R* W/ TOMO W/ CAD
4 series · 4 of 12 positions shown · non-contrast
Comparison: Previous exam(s).
COMPARISON: Previous exam(s).

Addendum:
CLINICAL DATA: Patient returns after screening study for evaluation
of possible RIGHT.

EXAM:
DIGITAL DIAGNOSTIC UNILATERAL RIGHT MAMMOGRAM WITH TOMOSYNTHESIS AND
CAD
TECHNIQUE: Right digital diagnostic mammography and breast tomosynthesis was
performed. The images were evaluated with computer-aided detection.
of possible RIGHT breast mass.
*** End of Addendum ***

[R MLO synth-2D]
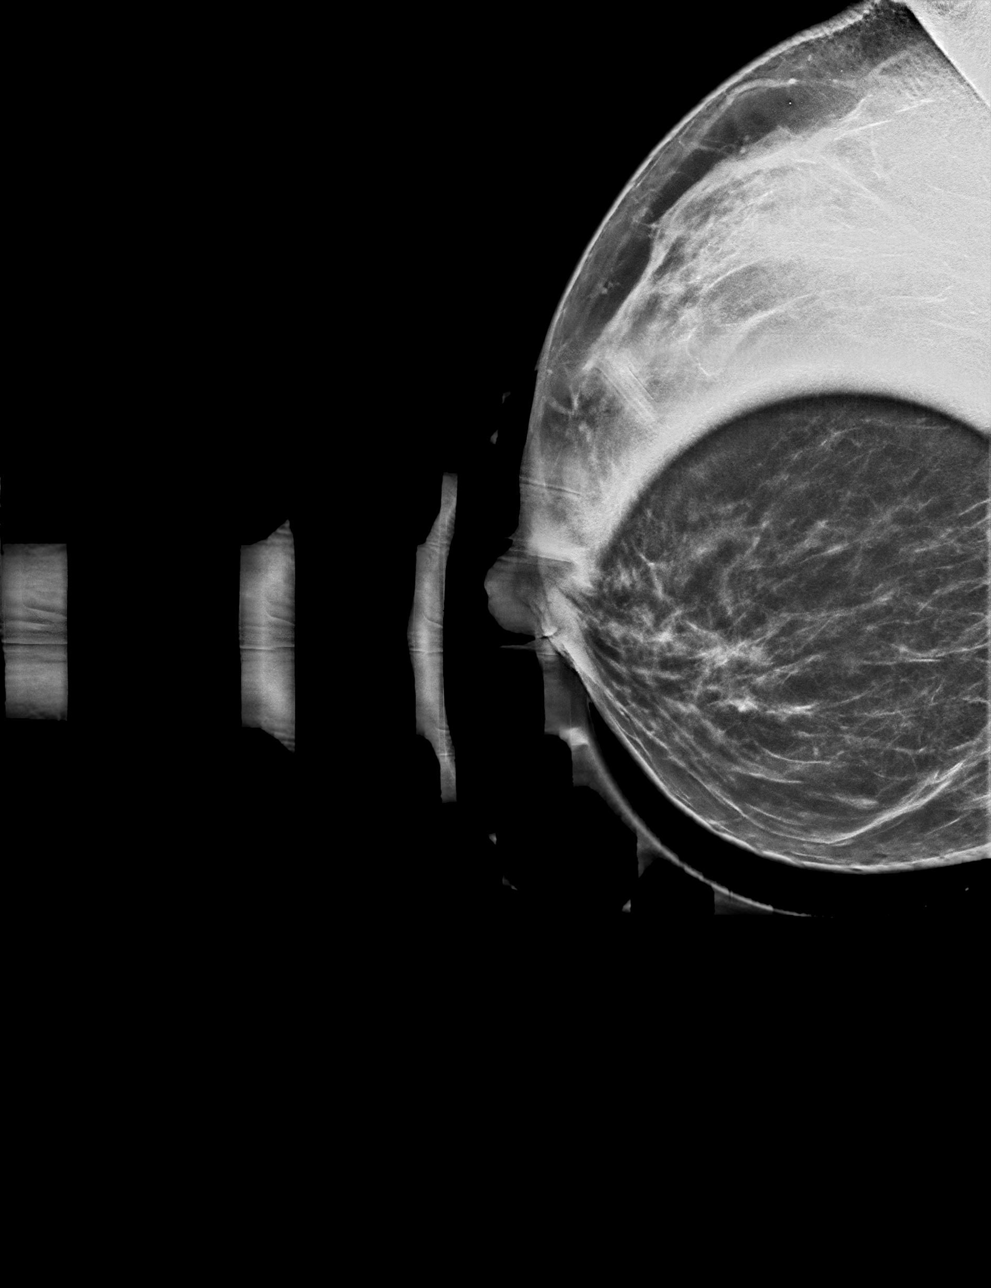

[R CC synth-2D]
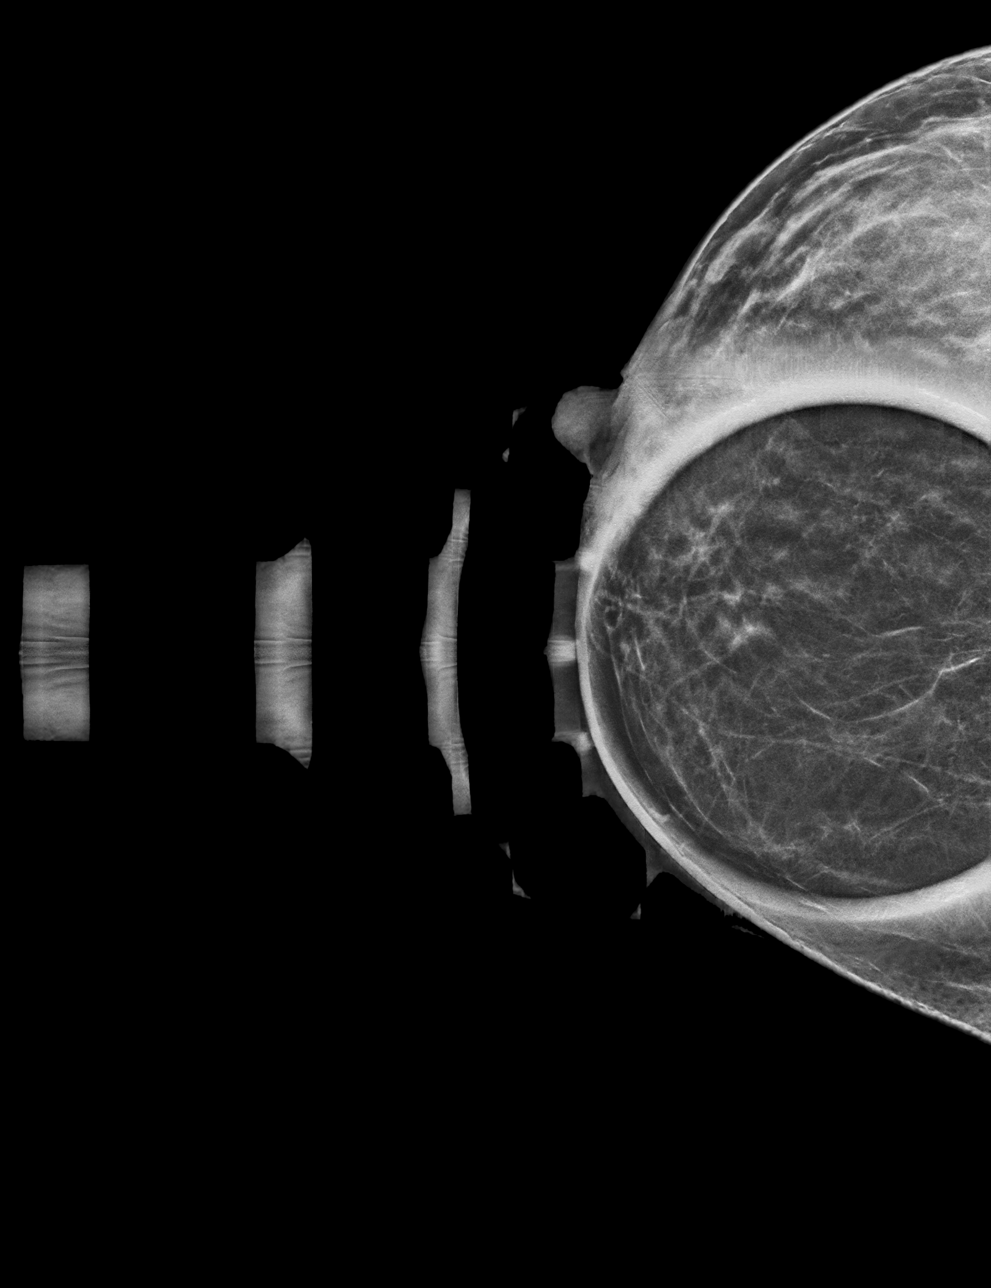

[R CC tomo · tomo slice 23/46.0]
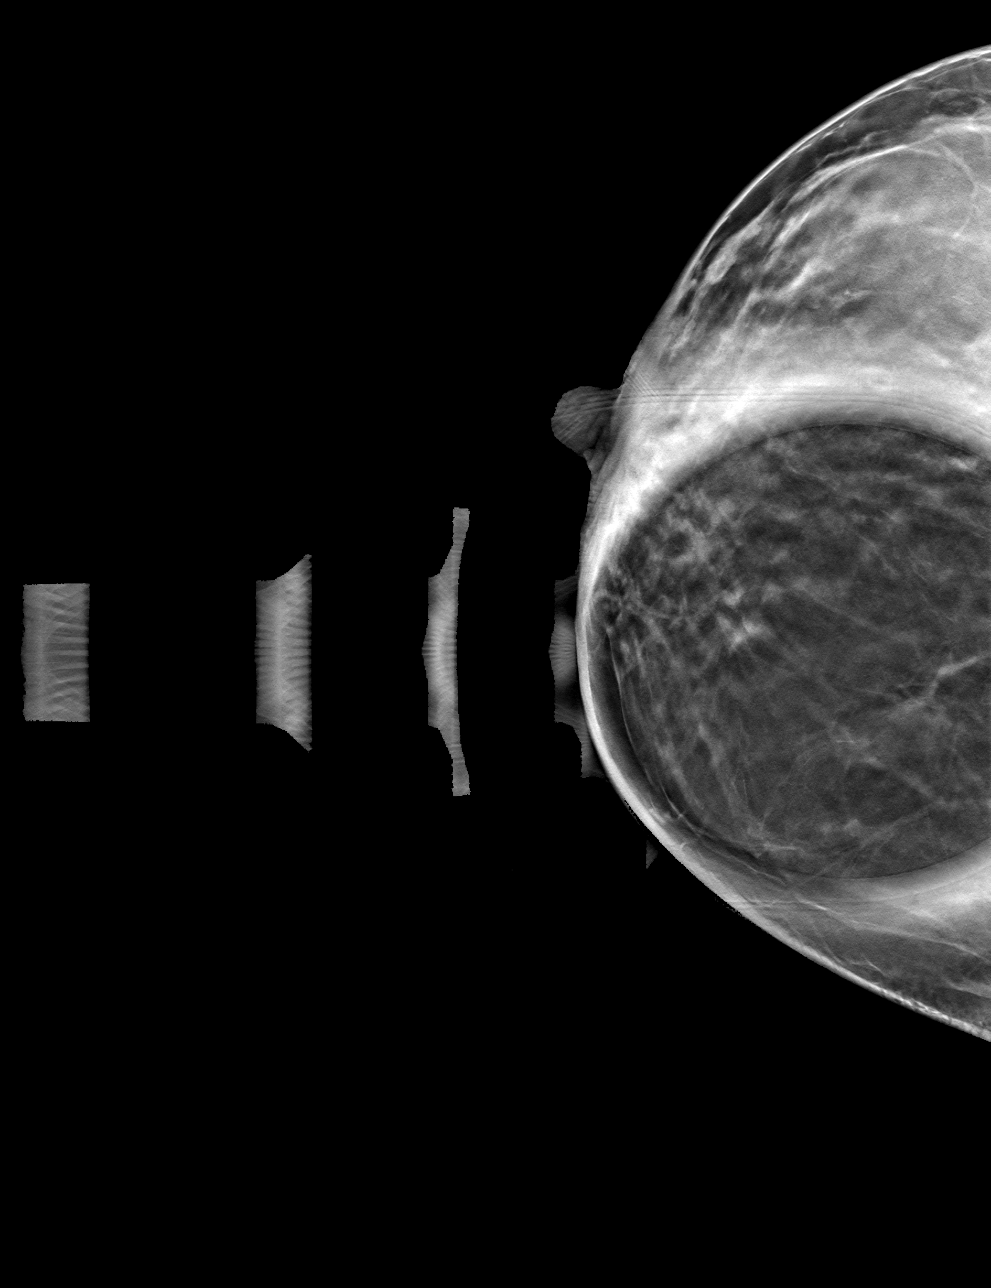

[R MLO tomo · tomo slice 29/57.0]
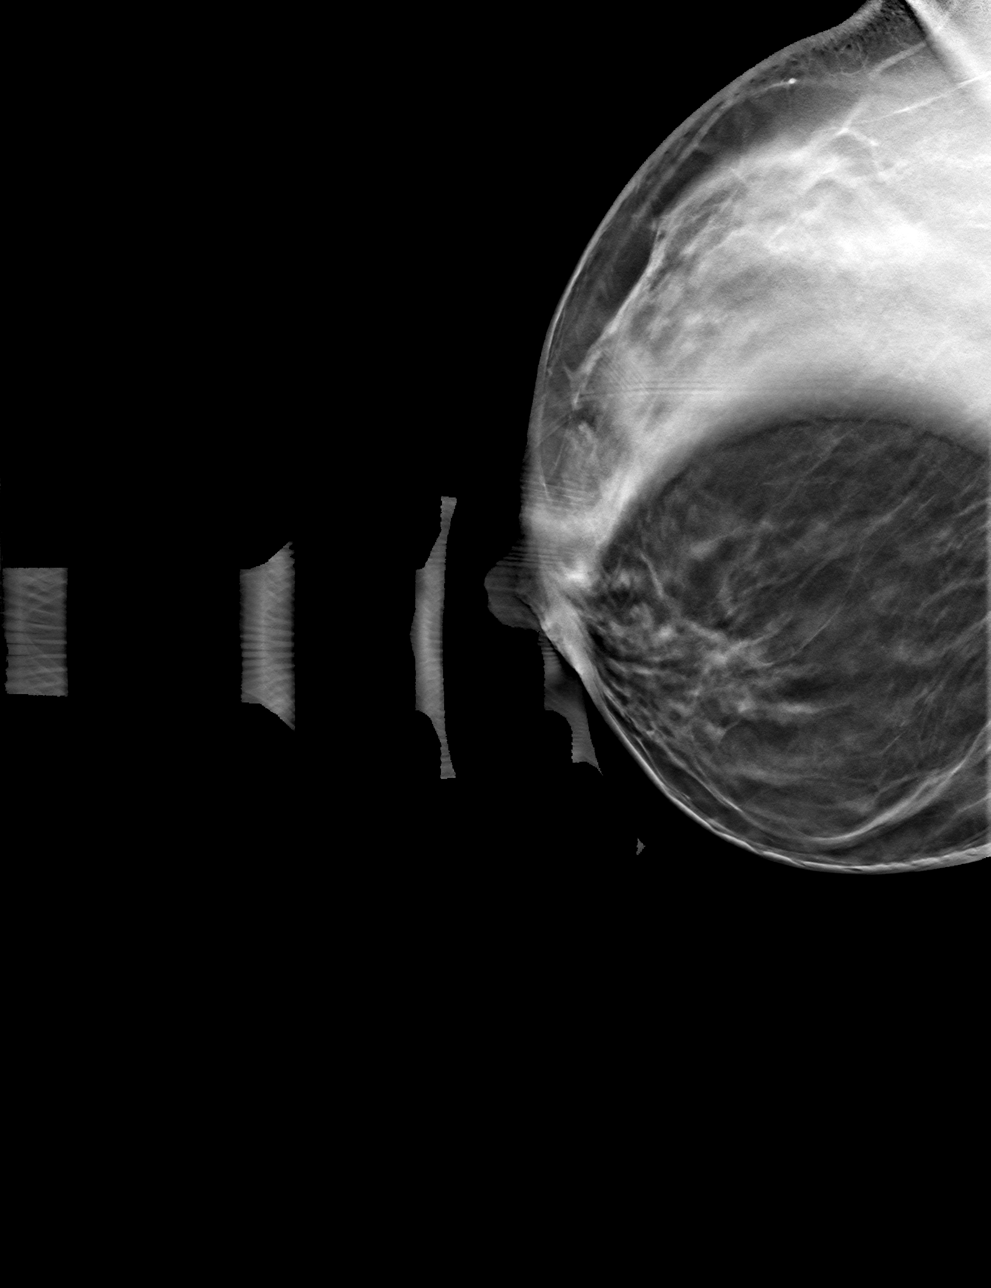

[4 of 12 positions shown; findings below may reference images not displayed]

ACR Breast Density Category b: There are scattered areas of
fibroglandular density.
FINDINGS: Additional 2-D and 3-D images are performed. These views show no
persistent mass in the LOWER INNER QUADRANT of RIGHT breast. No
suspicious mass, distortion, or microcalcifications are identified
to suggest presence of malignancy.
IMPRESSION: No mammographic evidence for malignancy.

RECOMMENDATION:
Screening mammogram in one year.(Code:10-R-GFP)

I have discussed the findings and recommendations with the patient.
If applicable, a reminder letter will be sent to the patient
regarding the next appointment.

BI-RADS CATEGORY  1: Negative.

ADDENDUM:
Corrected report :
ACR Breast Density Category b: There are scattered areas of
fibroglandular density.
FINDINGS: Additional 2-D and 3-D images are performed. These views show no
persistent mass in the LOWER INNER QUADRANT of RIGHT breast. No
suspicious mass, distortion, or microcalcifications are identified
to suggest presence of malignancy.
IMPRESSION: No mammographic evidence for malignancy.

RECOMMENDATION:
Screening mammogram in one year.(Code:10-R-GFP)

I have discussed the findings and recommendations with the patient.
If applicable, a reminder letter will be sent to the patient
regarding the next appointment.

BI-RADS CATEGORY  1: Negative.

## 2023-07-26 ENCOUNTER — Ambulatory Visit: Payer: Self-pay

## 2023-07-26 ENCOUNTER — Other Ambulatory Visit: Payer: Self-pay | Admitting: Physician Assistant

## 2023-07-26 ENCOUNTER — Ambulatory Visit: Admitting: Physician Assistant

## 2023-07-26 ENCOUNTER — Encounter: Payer: Self-pay | Admitting: Physician Assistant

## 2023-07-26 VITALS — BP 152/100 | HR 99 | Temp 98.3°F | Ht 67.0 in | Wt 187.0 lb

## 2023-07-26 DIAGNOSIS — R300 Dysuria: Secondary | ICD-10-CM

## 2023-07-26 DIAGNOSIS — N3001 Acute cystitis with hematuria: Secondary | ICD-10-CM

## 2023-07-26 LAB — POCT URINALYSIS DIPSTICK
Bilirubin, UA: NEGATIVE
Glucose, UA: NEGATIVE
Ketones, UA: NEGATIVE
Leukocytes, UA: NEGATIVE
Nitrite, UA: NEGATIVE
Protein, UA: NEGATIVE
Spec Grav, UA: 1.01 (ref 1.010–1.025)
Urobilinogen, UA: 0.2 U/dL
pH, UA: 6 (ref 5.0–8.0)

## 2023-07-26 MED ORDER — NITROFURANTOIN MONOHYD MACRO 100 MG PO CAPS: 100.0000 mg | ORAL_CAPSULE | Freq: Two times a day (BID) | ORAL | 0 refills | Status: AC

## 2023-07-26 NOTE — Telephone Encounter (Signed)
 Copied from CRM [id number]. Topic: Clinical - Red Word Triage >> Jul 26, 2023 12:29 PM Powell HERO wrote: Red Word that prompted transfer to Nurse Triage: Right side started hurting yesterday. States its under her ribs towards the front, and she has has some irritation when she is going to the bathroom. Think she has UTI.   FYI Only or Action Required?: FYI only for provider.  Patient was last seen in primary care on 03/28/2022 by Alvia Selinda PARAS, MD. Called Nurse Triage reporting Flank Pain. Symptoms began yesterday. Interventions attempted: OTC medications: AZO. Symptoms are: unchanged.  Triage Disposition: See HCP Within 4 Hours (Or PCP Triage)  Patient/caregiver understands and will follow disposition?: Yes   Reason for Disposition  Pain or burning with passing urine (urination)  Answer Assessment - Initial Assessment Questions 1. LOCATION: Where does it hurt? (e.g., left, right)     Right  2. ONSET: When did the pain start?     Last night  3. SEVERITY: How bad is the pain? (e.g., Scale 1-10; mild, moderate, or severe)   - MILD (1-3): doesn't interfere with normal activities    - MODERATE (4-7): interferes with normal activities or awakens from sleep    - SEVERE (8-10): excruciating pain and patient unable to do normal activities (stays in bed)       Mild 4. PATTERN: Does the pain come and go, or is it constant?      Constant  5. CAUSE: What do you think is causing the pain?     Possible UTI 6. OTHER SYMPTOMS:  Do you have any other symptoms? (e.g., fever, abdomen pain, vomiting, leg weakness, burning with urination, blood in urine)     Discomfort with urination  7. PREGNANCY:  Is there any chance you are pregnant? When was your last menstrual period?     No  Protocols used: Flank Pain-A-AH

## 2023-07-26 NOTE — Progress Notes (Signed)
 Date:  07/26/2023   Name:  Ashley Silva   DOB:  12-23-1975   MRN:  [id number]   Chief Complaint: Dysuria  Dysuria  This is a new problem. Episode onset: X3 days. Episode frequency: comes and goes. The problem has been unchanged. The quality of the pain is described as aching and burning. The pain is at a severity of 3/10. The pain is mild. There has been no fever. She is Sexually active. There is No history of pyelonephritis. Associated symptoms include flank pain and nausea. Treatments tried: AZO. The treatment provided mild relief.   Ashley Silva is a very pleasant 48 year old female new to me today, typically seen by my recently retired Animator Dr. Joshua, here for evaluation of acute UTI symptoms for the last 3 days including frequency, dysuria, right abdominal/flank pain, and nausea since last night without vomiting.  Azo does not seem to be helping.  Husband also recently diagnosed with UTI.  Completely unrelated, she reports that she has been more moody/irritable and has been having hot flashes for the last several months, wonders if she is in menopause.  History of hysterectomy, so no menstrual cycle.  She is overdue for routine physical   Medication list has been reviewed and updated.  Current Meds  Medication Sig   nitrofurantoin, macrocrystal-monohydrate, (MACROBID) 100 MG capsule Take 1 capsule (100 mg total) by mouth 2 (two) times daily for 5 days.     Review of Systems  Gastrointestinal:  Positive for nausea.  Genitourinary:  Positive for dysuria and flank pain.    Patient Active Problem List   Diagnosis Date Noted   Internal derangement of left shoulder 12/27/2021   Polyp of ascending colon    Menorrhagia with regular cycle 09/30/2020   Adenomyosis 09/30/2020   Endometrial polyp 09/30/2020   Chronic blood loss anemia 09/30/2020   Seborrheic keratoses 06/16/2019    No Known Allergies   There is no immunization history on file for this patient.  Past Surgical  History:  Procedure Laterality Date   CESAREAN SECTION     1   COLONOSCOPY N/A 06/10/2021   Procedure: COLONOSCOPY WITH BIOPSY;  Surgeon: Jinny Carmine, MD;  Location: [facility];  Service: Endoscopy;  Laterality: N/A;   CYSTOSCOPY N/A 09/30/2020   Procedure: CYSTOSCOPY;  Surgeon: Leonce Garnette BIRCH, MD;  Location: [facility];  Service: Gynecology;  Laterality: N/A;   ESOPHAGOGASTRODUODENOSCOPY N/A 06/10/2021   Procedure: ESOPHAGOGASTRODUODENOSCOPY (EGD) WITH BIOPSY;  Surgeon: Jinny Carmine, MD;  Location: [facility];  Service: Endoscopy;  Laterality: N/A;   POLYPECTOMY N/A 06/10/2021   Procedure: POLYPECTOMY;  Surgeon: Jinny Carmine, MD;  Location: [facility];  Service: Endoscopy;  Laterality: N/A;   ROBOTIC ASSISTED LAPAROSCOPIC HYSTERECTOMY AND SALPINGECTOMY Bilateral 09/30/2020   Procedure: XI ROBOTIC ASSISTED LAPAROSCOPIC HYSTERECTOMY AND SALPINGECTOMY;  Surgeon: Leonce Garnette BIRCH, MD;  Location: [facility];  Service: Gynecology;  Laterality: Bilateral;    Social History   Tobacco Use   Smoking status: Former    Current packs/day: 0.00    Types: Cigarettes    Start date: 01/23/1991    Quit date: 01/23/1996    Years since quitting: 27.5   Smokeless tobacco: Never  Vaping Use   Vaping status: Never Used  Substance Use Topics   Alcohol use: Yes    Comment: occasionally   Drug use: Never    Family History  Problem Relation Age of Onset   Hypertension Mother    Other Father  unknown medical history   Heart disease Maternal Grandmother    Stroke Maternal Grandfather    Breast cancer Neg Hx         07/26/2023    4:00 PM 03/28/2021    8:58 AM 04/12/2020    2:49 PM 03/19/2020    2:08 PM  GAD 7 : Generalized Anxiety Score  Nervous, Anxious, on Edge 0 0 2 2  Control/stop worrying 0 0 1 1  Worry too much - different things 0 0 1 1  Trouble relaxing 0 0 1 1  Restless 0 0 1 1  Easily annoyed or irritable 1 0 2 2  Afraid - awful might happen 0 0 0 0   Total GAD 7 Score 1 0 8 8  Anxiety Difficulty Not difficult at all Not difficult at all Not difficult at all Somewhat difficult       07/26/2023    4:00 PM 03/28/2021    8:58 AM 04/12/2020    2:49 PM  Depression screen PHQ 2/9  Decreased Interest 0 0 0  Down, Depressed, Hopeless 0 0 1  PHQ - 2 Score 0 0 1  Altered sleeping  0 1  Tired, decreased energy  0 2  Change in appetite  0 1  Feeling bad or failure about yourself   0 0  Trouble concentrating  0 0  Moving slowly or fidgety/restless  0 2  Suicidal thoughts  0 0  PHQ-9 Score  0 7  Difficult doing work/chores  Not difficult at all Not difficult at all    BP Readings from Last 3 Encounters:  07/26/23 (!) 152/100  09/02/22 (!) 163/92  03/28/22 (!) 150/90    Wt Readings from Last 3 Encounters:  07/26/23 187 lb (84.8 kg)  09/02/22 182 lb 1.6 oz (82.6 kg)  03/28/22 182 lb (82.6 kg)    BP (!) 152/100   Pulse 99   Temp 98.3 F (36.8 C)   Ht 5' 7 (1.702 m)   Wt 187 lb (84.8 kg)   LMP 07/10/2020 (Exact Date)   SpO2 99%   BMI 29.29 kg/m   Physical Exam Vitals and nursing note reviewed.  Constitutional:      Appearance: Normal appearance.   Cardiovascular:     Rate and Rhythm: Normal rate and regular rhythm.     Heart sounds: No murmur heard.    No friction rub. No gallop.  Pulmonary:     Effort: Pulmonary effort is normal.     Breath sounds: Normal breath sounds.  Abdominal:     General: There is no distension.     Palpations: Abdomen is soft.     Tenderness: There is no abdominal tenderness. There is no right CVA tenderness or left CVA tenderness.   Musculoskeletal:        General: Normal range of motion.   Skin:    General: Skin is warm and dry.   Neurological:     Mental Status: She is alert and oriented to person, place, and time.     Gait: Gait is intact.   Psychiatric:        Mood and Affect: Mood and affect normal.    Lab Results  Component Value Date   COLORU yellow 07/26/2023    CLARITYU clear 07/26/2023   GLUCOSEUR Negative 07/26/2023   BILIRUBINUR neg 07/26/2023   KETONESU neg 07/26/2023   SPECGRAV 1.010 07/26/2023   RBCUR small (A) 07/26/2023   PHUR 6.0 07/26/2023   PROTEINUR Negative 07/26/2023  UROBILINOGEN 0.2 07/26/2023   LEUKOCYTESUR Negative 07/26/2023     Recent Labs     Component Value Date/Time   NA 138 09/24/2020 1021   NA 140 03/25/2020 1054   K 3.8 09/24/2020 1021   CL 109 09/24/2020 1021   CO2 23 09/24/2020 1021   GLUCOSE 102 (H) 09/24/2020 1021   BUN 13 09/24/2020 1021   BUN 12 03/25/2020 1054   CREATININE 0.80 09/24/2020 1021   CALCIUM 9.4 09/24/2020 1021   PROT 8.1 09/24/2020 1021   PROT 7.6 03/25/2020 1054   ALBUMIN 4.8 09/24/2020 1021   ALBUMIN 4.8 03/25/2020 1054   AST 16 09/24/2020 1021   ALT 11 09/24/2020 1021   ALKPHOS 44 09/24/2020 1021   BILITOT 0.7 09/24/2020 1021   BILITOT 0.3 03/25/2020 1054   GFRNONAA >60 09/24/2020 1021   GFRAA 105 03/25/2020 1054    Lab Results  Component Value Date   WBC 6.1 03/28/2021   HGB 10.6 (L) 03/28/2021   HCT 36.0 03/28/2021   MCV 70 (L) 03/28/2021   PLT 248 03/28/2021   No results found for: HGBA1C Lab Results  Component Value Date   CHOL 217 (H) 03/28/2021   HDL 53 03/28/2021   LDLCALC 147 (H) 03/28/2021   TRIG 96 03/28/2021   Lab Results  Component Value Date   TSH 2.520 04/12/2020     Assessment and Plan:  1. Acute cystitis with hematuria (Primary) Urine dipstick negative today aside from a small amount of blood.  Will treat presumptively based on symptoms with nitrofurantoin, also sending for culture.  Patient reassured of normal physical exam today.  - nitrofurantoin, macrocrystal-monohydrate, (MACROBID) 100 MG capsule; Take 1 capsule (100 mg total) by mouth 2 (two) times daily for 5 days.  Dispense: 10 capsule; Refill: 0  2. Dysuria - POCT Urinalysis Dipstick - Urine Culture     Return in about 3 months (around 10/26/2023) for CPE.    Rolan Hoyle,  PA-C, DMSc, Nutritionist [facility] [phone]

## 2023-07-26 NOTE — Telephone Encounter (Signed)
 Noted  Pt has a appt.  KP

## 2023-07-28 LAB — URINE CULTURE

## 2023-07-31 ENCOUNTER — Ambulatory Visit: Payer: Self-pay | Admitting: Physician Assistant

## 2023-09-20 ENCOUNTER — Ambulatory Visit (INDEPENDENT_AMBULATORY_CARE_PROVIDER_SITE_OTHER)

## 2023-09-20 ENCOUNTER — Ambulatory Visit
Admission: EM | Admit: 2023-09-20 | Discharge: 2023-09-20 | Disposition: A | Discharge status: Home / Self Care | Attending: Physician Assistant | Admitting: Physician Assistant

## 2023-09-20 ENCOUNTER — Ambulatory Visit

## 2023-09-20 DIAGNOSIS — R0781 Pleurodynia: Secondary | ICD-10-CM

## 2023-09-20 DIAGNOSIS — R030 Elevated blood-pressure reading, without diagnosis of hypertension: Secondary | ICD-10-CM | POA: Diagnosis not present

## 2023-09-20 DIAGNOSIS — R1011 Right upper quadrant pain: Secondary | ICD-10-CM | POA: Diagnosis not present

## 2023-09-20 DIAGNOSIS — I878 Other specified disorders of veins: Secondary | ICD-10-CM | POA: Diagnosis not present

## 2023-09-20 DIAGNOSIS — R630 Anorexia: Secondary | ICD-10-CM | POA: Diagnosis not present

## 2023-09-20 DIAGNOSIS — R3129 Other microscopic hematuria: Secondary | ICD-10-CM

## 2023-09-20 DIAGNOSIS — R109 Unspecified abdominal pain: Secondary | ICD-10-CM

## 2023-09-20 DIAGNOSIS — R110 Nausea: Secondary | ICD-10-CM | POA: Diagnosis not present

## 2023-09-20 LAB — URINALYSIS, W/ REFLEX TO CULTURE (INFECTION SUSPECTED)
Bilirubin Urine: NEGATIVE
Glucose, UA: NEGATIVE mg/dL
Ketones, ur: NEGATIVE mg/dL
Leukocytes,Ua: NEGATIVE
Nitrite: NEGATIVE
Protein, ur: NEGATIVE mg/dL
Specific Gravity, Urine: 1.02 (ref 1.005–1.030)
WBC, UA: NONE SEEN WBC/hpf (ref 0–5)
pH: 5.5 (ref 5.0–8.0)

## 2023-09-20 LAB — CBC WITH DIFFERENTIAL/PLATELET
Abs Immature Granulocytes: 0.02 K/uL (ref 0.00–0.07)
Basophils Absolute: 0.1 K/uL (ref 0.0–0.1)
Basophils Relative: 1 %
Eosinophils Absolute: 0.1 K/uL (ref 0.0–0.5)
Eosinophils Relative: 1 %
HCT: 44.2 % (ref 36.0–46.0)
Hemoglobin: 15.3 g/dL — ABNORMAL HIGH (ref 12.0–15.0)
Immature Granulocytes: 0 %
Lymphocytes Relative: 19 %
Lymphs Abs: 1.4 K/uL (ref 0.7–4.0)
MCH: 29.3 pg (ref 26.0–34.0)
MCHC: 34.6 g/dL (ref 30.0–36.0)
MCV: 84.5 fL (ref 80.0–100.0)
Monocytes Absolute: 0.3 K/uL (ref 0.1–1.0)
Monocytes Relative: 4 %
Neutro Abs: 5.4 K/uL (ref 1.7–7.7)
Neutrophils Relative %: 75 %
Platelets: 236 K/uL (ref 150–400)
RBC: 5.23 MIL/uL — ABNORMAL HIGH (ref 3.87–5.11)
RDW: 12.9 % (ref 11.5–15.5)
WBC: 7.3 K/uL (ref 4.0–10.5)
nRBC: 0 % (ref 0.0–0.2)

## 2023-09-20 LAB — COMPREHENSIVE METABOLIC PANEL WITH GFR
ALT: 17 U/L (ref 0–44)
AST: 19 U/L (ref 15–41)
Albumin: 4.7 g/dL (ref 3.5–5.0)
Alkaline Phosphatase: 62 U/L (ref 38–126)
Anion gap: 10 (ref 5–15)
BUN: 13 mg/dL (ref 6–20)
CO2: 25 mmol/L (ref 22–32)
Calcium: 9.7 mg/dL (ref 8.9–10.3)
Chloride: 97 mmol/L — ABNORMAL LOW (ref 98–111)
Creatinine, Ser: 0.86 mg/dL (ref 0.44–1.00)
GFR, Estimated: >60 mL/min (ref >60)
Glucose, Bld: 97 mg/dL (ref 70–99)
Potassium: 3.9 mmol/L (ref 3.5–5.1)
Sodium: 132 mmol/L — ABNORMAL LOW (ref 135–145)
Total Bilirubin: 0.9 mg/dL (ref 0.0–1.2)
Total Protein: 8.4 g/dL — ABNORMAL HIGH (ref 6.5–8.1)

## 2023-09-20 LAB — LIPASE, BLOOD: Lipase: 36 U/L (ref 11–51)

## 2023-09-20 MED ORDER — KETOROLAC TROMETHAMINE 10 MG PO TABS
10.0000 mg | ORAL_TABLET | Freq: Four times a day (QID) | ORAL | 0 refills | Status: AC | PRN
Start: 2023-09-20 — End: ?

## 2023-09-20 MED ORDER — KETOROLAC TROMETHAMINE 60 MG/2ML IM SOLN
30.0000 mg | Freq: Once | INTRAMUSCULAR | Status: AC
  Administered 2023-09-20: 30 mg via INTRAMUSCULAR

## 2023-09-20 MED ORDER — TAMSULOSIN HCL 0.4 MG PO CAPS: 0.4000 mg | ORAL_CAPSULE | Freq: Every day | ORAL | 0 refills | Status: AC

## 2023-09-20 NOTE — ED Triage Notes (Addendum)
 Patient states that she's been having right sided back pain that radiates around under her ribs. Patient staes that she's been having this pain for 3 days. Patient states that the pain is a dull pain. Didn't do anything different. [Patient states that she had the same pain about a month ago, went to her PCP and had her urine checked, they told her that there was a little bit of bacteria in the urine. Patient sttaes that she feels nauseas.

## 2023-09-20 NOTE — ED Provider Notes (Signed)
 [facility]    CSN: [id number] Arrival date & time: 09/20/23  9170      History   Chief Complaint Chief Complaint  Patient presents with   Back Pain    HPI Ashley Silva is a 48 y.o. female presenting for right sided flank pain radiating to right ribs and RUQ x 3 days. Pain is constant and aching. It is associated with reduced appetite and nausea without vomiting. No fever, cough, congestion, chest pain, shortness of breath. No diarrhea, vomiting, dysuria, urinary urgency/frequency, hematuria. Has not taken anything for pain relief. Symptoms feel similar to when she saw PCP nearly 2 months ago and was treated for UTI. She did have some urinary symptoms at that time. Urine culture showed mixed urogenital flora but symptoms seemed to go away after antibiotics.   HPI  Past Medical History:  Diagnosis Date   Hypoglycemia     Patient Active Problem List   Diagnosis Date Noted   Internal derangement of left shoulder 12/27/2021   Polyp of ascending colon    Menorrhagia with regular cycle 09/30/2020   Adenomyosis 09/30/2020   Endometrial polyp 09/30/2020   Chronic blood loss anemia 09/30/2020   Seborrheic keratoses 06/16/2019    Past Surgical History:  Procedure Laterality Date   CESAREAN SECTION     1   COLONOSCOPY N/A 06/10/2021   Procedure: COLONOSCOPY WITH BIOPSY;  Surgeon: Jinny Carmine, MD;  Location: [facility];  Service: Endoscopy;  Laterality: N/A;   CYSTOSCOPY N/A 09/30/2020   Procedure: CYSTOSCOPY;  Surgeon: Leonce Garnette BIRCH, MD;  Location: [facility];  Service: Gynecology;  Laterality: N/A;   ESOPHAGOGASTRODUODENOSCOPY N/A 06/10/2021   Procedure: ESOPHAGOGASTRODUODENOSCOPY (EGD) WITH BIOPSY;  Surgeon: Jinny Carmine, MD;  Location: [facility];  Service: Endoscopy;  Laterality: N/A;   POLYPECTOMY N/A 06/10/2021   Procedure: POLYPECTOMY;  Surgeon: Jinny Carmine, MD;  Location: [facility];  Service: Endoscopy;  Laterality: N/A;   ROBOTIC  ASSISTED LAPAROSCOPIC HYSTERECTOMY AND SALPINGECTOMY Bilateral 09/30/2020   Procedure: XI ROBOTIC ASSISTED LAPAROSCOPIC HYSTERECTOMY AND SALPINGECTOMY;  Surgeon: Leonce Garnette BIRCH, MD;  Location: [facility];  Service: Gynecology;  Laterality: Bilateral;    OB History     Gravida  2   Para  2   Term  2   Preterm      AB      Living  2      SAB      IAB      Ectopic      Multiple      Live Births               Home Medications    Prior to Admission medications   Medication Sig Start Date End Date Taking? Authorizing Provider  ketorolac  (TORADOL ) 10 MG tablet Take 1 tablet (10 mg total) by mouth every 6 (six) hours as needed. 09/20/23  Yes Arvis Huxley B, PA-C  tamsulosin  (FLOMAX ) 0.4 MG CAPS capsule Take 1 capsule (0.4 mg total) by mouth daily. 09/20/23  Yes Arvis Huxley B, PA-C  albuterol  (PROVENTIL  HFA;VENTOLIN  HFA) 108 (90 Base) MCG/ACT inhaler Inhale 2 puffs into the lungs every 6 (six) hours as needed for wheezing or shortness of breath. 10/26/17 01/07/19  Pasco Alexandria, MD    Family History Family History  Problem Relation Age of Onset   Hypertension Mother    Other Father        unknown medical history   Heart disease Maternal Grandmother    Stroke Maternal  Grandfather    Breast cancer Neg Hx     Social History Social History   Tobacco Use   Smoking status: Former    Current packs/day: 0.00    Types: Cigarettes    Start date: 01/23/1991    Quit date: 01/23/1996    Years since quitting: 27.6   Smokeless tobacco: Never  Vaping Use   Vaping status: Never Used  Substance Use Topics   Alcohol use: Yes    Comment: occasionally   Drug use: Never     Allergies   Patient has no known allergies.   Review of Systems Review of Systems  Constitutional:  Positive for appetite change. Negative for chills, diaphoresis, fatigue and fever.  HENT:  Negative for congestion.   Respiratory:  Negative for cough, shortness of breath and wheezing.    Cardiovascular:  Negative for chest pain.  Gastrointestinal:  Positive for abdominal pain and nausea. Negative for blood in stool, constipation, diarrhea and vomiting.  Genitourinary:  Positive for flank pain. Negative for difficulty urinating, dysuria, frequency, hematuria and urgency.  Musculoskeletal:  Positive for back pain. Negative for arthralgias.  Skin:  Negative for rash.  Neurological:  Negative for weakness and headaches.     Physical Exam Triage Vital Signs ED Triage Vitals  Encounter Vitals Group     BP 09/20/23 0904 (!) 173/107     Girls Systolic BP Percentile --      Girls Diastolic BP Percentile --      Boys Systolic BP Percentile --      Boys Diastolic BP Percentile --      Pulse Rate 09/20/23 0904 88     Resp 09/20/23 0904 17     Temp 09/20/23 0904 98.8 F (37.1 C)     Temp Source 09/20/23 0904 Oral     SpO2 09/20/23 0904 97 %     Weight 09/20/23 0903 198 lb (89.8 kg)     Height --      Head Circumference --      Peak Flow --      Pain Score 09/20/23 0903 4     Pain Loc --      Pain Education --      Exclude from Growth Chart --    No data found.  Updated Vital Signs BP (!) 168/98 (BP Location: Right Arm)   Pulse 88   Temp 98.8 F (37.1 C) (Oral)   Resp 17   Wt 198 lb (89.8 kg)   LMP 07/10/2020 (Exact Date)   SpO2 97%   BMI 31.01 kg/m      Physical Exam Vitals and nursing note reviewed.  Constitutional:      General: She is not in acute distress.    Appearance: Normal appearance. She is not ill-appearing or toxic-appearing.  HENT:     Head: Normocephalic and atraumatic.     Nose: Nose normal.     Mouth/Throat:     Mouth: Mucous membranes are moist.     Pharynx: Oropharynx is clear.  Eyes:     General: No scleral icterus.       Right eye: No discharge.        Left eye: No discharge.     Conjunctiva/sclera: Conjunctivae normal.  Cardiovascular:     Rate and Rhythm: Normal rate and regular rhythm.     Heart sounds: Normal heart  sounds.  Pulmonary:     Effort: Pulmonary effort is normal. No respiratory distress.     Breath sounds:  Normal breath sounds.  Abdominal:     Palpations: Abdomen is soft.     Tenderness: There is abdominal tenderness in the right upper quadrant and epigastric area. There is right CVA tenderness. There is no left CVA tenderness.  Musculoskeletal:     Cervical back: Neck supple.  Skin:    General: Skin is dry.  Neurological:     General: No focal deficit present.     Mental Status: She is alert. Mental status is at baseline.     Motor: No weakness.     Gait: Gait normal.  Psychiatric:        Mood and Affect: Mood normal.        Behavior: Behavior normal.      UC Treatments / Results  Labs (all labs ordered are listed, but only abnormal results are displayed) Labs Reviewed  URINALYSIS, W/ REFLEX TO CULTURE (INFECTION SUSPECTED) - Abnormal; Notable for the following components:      Result Value   Hgb urine dipstick TRACE (*)    Bacteria, UA RARE (*)    All other components within normal limits  COMPREHENSIVE METABOLIC PANEL WITH GFR - Abnormal; Notable for the following components:   Sodium 132 (*)    Chloride 97 (*)    Total Protein 8.4 (*)    All other components within normal limits  CBC WITH DIFFERENTIAL/PLATELET - Abnormal; Notable for the following components:   RBC 5.23 (*)    Hemoglobin 15.3 (*)    All other components within normal limits  LIPASE, BLOOD    EKG   Radiology US  Abdomen Limited RUQ (LIVER/GB) Result Date: 09/20/2023 CLINICAL DATA:  Right upper quadrant abdominal pain.  Nausea. EXAM: ULTRASOUND ABDOMEN LIMITED RIGHT UPPER QUADRANT COMPARISON:  None Available. FINDINGS: Gallbladder: No gallstones or wall thickening visualized. No sonographic Murphy sign noted by sonographer. Common bile duct: Diameter: 2.7 mm.  Normal. Liver: No focal lesion identified. Within normal limits in parenchymal echogenicity. Portal vein is patent on color Doppler imaging  with normal direction of blood flow towards the liver. Other: None. IMPRESSION: Normal right upper quadrant ultrasound. Electronically Signed   By: Oneil Officer M.D.   On: 09/20/2023 11:19   DG Chest 2 View Result Date: 09/20/2023 CLINICAL DATA:  right flank pain. right upper quadrant pain EXAM: CHEST - 2 VIEW COMPARISON:  10/26/2017. FINDINGS: The heart size and mediastinal contours are within normal limits. No focal consolidation, pleural effusion, or pneumothorax. No acute osseous abnormality. IMPRESSION: No active cardiopulmonary disease. Electronically Signed   By: Harrietta Sherry M.D.   On: 09/20/2023 10:17   DG Abdomen 1 View Result Date: 09/20/2023 CLINICAL DATA:  Right flank pain. EXAM: ABDOMEN - 1 VIEW COMPARISON:  None Available. FINDINGS: Nonobstructive bowel gas pattern. No radio-opaque calculi are seen. Multiple pelvic phleboliths. No acute osseous abnormality. Mild degenerative arthropathy of the right-greater-than-left sacroiliac joints. IMPRESSION: 1. Nonobstructive bowel gas pattern. No definite radiopaque calculi identified. 2. Mild degenerative arthropathy of the right-greater-than-left sacroiliac joints. Electronically Signed   By: Harrietta Sherry M.D.   On: 09/20/2023 10:16    Procedures Procedures (including critical care time)  Medications Ordered in UC Medications  ketorolac  (TORADOL ) injection 30 mg (30 mg Intramuscular Given 09/20/23 1120)    Initial Impression / Assessment and Plan / UC Course  I have reviewed the triage vital signs and the nursing notes.  Pertinent labs & imaging results that were available during my care of the patient were reviewed by me and considered in  my medical decision making (see chart for details).   48 y/o female presents for right flank pain radiating to RUQ and right ribs x 3 days. Also has had fatigue, loss of appetite and nausea. Similar symptoms 2 months ago with presumed UTI (culture negative).  BP elevated at 173/107.  Recheck  is 175/94.  Another recheck is 168/98.  Patient not on any blood pressure medications.  Previous BP elevated at PCP office 2 months ago at 152/100.  Vies to keep log of BP and follow-up with PCP if consistently greater than 140/90.  May need to be on medication.  On exam, she has right CVA tenderness, right upper quadrant TTP and epigastric TTP.  UA not consistent with UTI. Microscopic blood present.   CBC, CMP, lipase, KUB and CXR obtained.  CBC, CMP and lipase without any significant abnormalities.  CXR normal.  KUB without obvious stones.  Patient given 30 mg IM ketorolac  in clinic for pain relief.  Awaiting results of right upper quadrant ultrasound.  Advised patient I will contact her and discuss them and treatment plan further once those results are back.   Right upper quadrant ultrasound was negative.  Called to discuss result with patient.  We discussed different possibilities for her symptoms.  Most likely seems to be a kidney stone that is not picked up on x-ray.  Advised making a follow-up appointment with PCP to have a CT scan to further evaluate.  At this time she would like to go ahead with treatment for possible kidney stone.  I sent ketorolac  and tamsulosin  to pharmacy and encouraged plenty of fluids.  May continue Tylenol .  We discussed ED precautions.  Advised to go to the ER if the pain worsens or she develops a fever, otherwise reach out to her PCP to make a follow-up.  Patient is agreeable to plan.   Final Clinical Impressions(s) / UC Diagnoses   Final diagnoses:  Right flank pain  Rib pain on right side  Abdominal pain, right upper quadrant  Nausea without vomiting  Loss of appetite  Microscopic hematuria  Elevated blood pressure reading in office without diagnosis of hypertension     Discharge Instructions      - Your lab work was all reassuring. - Your chest x-ray was normal. - Your KUB x-ray does not show any obvious kidney stones. - Still waiting on  results of your liver/gallbladder ultrasound and I will call you once they return. - If you have gallstones we can just treat your pain and refer you to general surgery for consult. - If gallbladder wall is thickened and shows any concern for infection he will be directed to go to the emergency department right away. - If your ultrasound is completely negative, there could still be concern for possible kidney stone that is not showing up on x-ray.  In this case you may need to have a CT scan to further evaluate this but I can treat you with medication as needed for pain relief and something to help move a stone so you can pass it. -You were given an injection of ketorolac  in clinic for acute pain relief. - At this time increase rest and fluids.  I will call you as soon as your results return.     ED Prescriptions     Medication Sig Dispense Auth. Provider   ketorolac  (TORADOL ) 10 MG tablet Take 1 tablet (10 mg total) by mouth every 6 (six) hours as needed. 20 tablet Arvis Jolan NOVAK,  PA-C   tamsulosin  (FLOMAX ) 0.4 MG CAPS capsule Take 1 capsule (0.4 mg total) by mouth daily. 30 capsule Arvis Jolan NOVAK, PA-C      I have reviewed the PDMP during this encounter.   Arvis Jolan NOVAK, PA-C 09/20/23 [phone]

## 2023-09-20 NOTE — Discharge Instructions (Signed)
-   Your lab work was all reassuring. - Your chest x-ray was normal. - Your KUB x-ray does not show any obvious kidney stones. - Still waiting on results of your liver/gallbladder ultrasound and I will call you once they return. - If you have gallstones we can just treat your pain and refer you to general surgery for consult. - If gallbladder wall is thickened and shows any concern for infection he will be directed to go to the emergency department right away. - If your ultrasound is completely negative, there could still be concern for possible kidney stone that is not showing up on x-ray.  In this case you may need to have a CT scan to further evaluate this but I can treat you with medication as needed for pain relief and something to help move a stone so you can pass it. -You were given an injection of ketorolac  in clinic for acute pain relief. - At this time increase rest and fluids.  I will call you as soon as your results return.

## 2023-09-26 ENCOUNTER — Ambulatory Visit (INDEPENDENT_AMBULATORY_CARE_PROVIDER_SITE_OTHER): Admitting: Physician Assistant

## 2023-09-26 ENCOUNTER — Encounter: Payer: Self-pay | Admitting: Physician Assistant

## 2023-09-26 VITALS — BP 130/80 | HR 96 | Temp 97.8°F | Ht 67.0 in | Wt 190.4 lb

## 2023-09-26 DIAGNOSIS — R109 Unspecified abdominal pain: Secondary | ICD-10-CM

## 2023-09-26 LAB — POCT URINALYSIS DIPSTICK
Bilirubin, UA: NEGATIVE
Blood, UA: NEGATIVE
Glucose, UA: NEGATIVE
Ketones, UA: NEGATIVE
Leukocytes, UA: NEGATIVE
Nitrite, UA: NEGATIVE
Protein, UA: NEGATIVE
Spec Grav, UA: 1.01 (ref 1.010–1.025)
Urobilinogen, UA: 0.2 U/dL
pH, UA: 6 (ref 5.0–8.0)

## 2023-09-26 MED ORDER — SULFAMETHOXAZOLE-TRIMETHOPRIM 800-160 MG PO TABS
1.0000 | ORAL_TABLET | Freq: Two times a day (BID) | ORAL | 0 refills | Status: AC
Start: 2023-09-26 — End: 2023-10-01

## 2023-09-26 NOTE — Progress Notes (Signed)
 Date:  09/26/2023   Name:  Ashley Silva   DOB:  11/19/1975   MRN:  [id number]   Chief Complaint: Flank Pain (Patient presents today with right sided flank pain. She's had this pain for going on 3 weeks. She has been treated by UC for kidney stone, was given toradol  and flomax . She does not believe she has expelled any kidney stones.)  Flank Pain   Jaslyn returns for f/u on right flank pain for the last 9 days after seeing urgent care 09/20/23 with suspicion for kidney stone, though nothing was picked up on KUB. Associated nausea and low appetite. No vomiting. RUQ u/s normal. Dipstick showed small amount of blood. She was treated with ketorolac  and tamsulosin , advised to f/u with PCP.   Pain similar to last visit with me 07/26/23, except she does not currently have frequency or dysuria like she did last time. In June we treated with nitrofurantoin  which seemed to help temporarily. Presently, pain fluctuates between 4-6/10 with no clear pattern. She is tolerating oral intake. No acid/GERD symptoms. She mentioned on Monday she felt better, but yesterday symptoms were worse, with new pain in the bladder area like a needle so she thought she was about to pass a stone, but did not see anything in the toilet. Has not been filtering her urine.   Blood pressure high over the last several visits and was bordering HTN urgency while at urgent care.    Medication list has been reviewed and updated.  Current Meds  Medication Sig   sulfamethoxazole -trimethoprim  (BACTRIM  DS) 800-160 MG tablet Take 1 tablet by mouth 2 (two) times daily for 5 days.   tamsulosin  (FLOMAX ) 0.4 MG CAPS capsule Take 1 capsule (0.4 mg total) by mouth daily.     Review of Systems  Genitourinary:  Positive for flank pain.    Patient Active Problem List   Diagnosis Date Noted   Internal derangement of left shoulder 12/27/2021   Polyp of ascending colon    Menorrhagia with regular cycle 09/30/2020   Adenomyosis 09/30/2020    Endometrial polyp 09/30/2020   Chronic blood loss anemia 09/30/2020   Seborrheic keratoses 06/16/2019    No Known Allergies   There is no immunization history on file for this patient.  Past Surgical History:  Procedure Laterality Date   CESAREAN SECTION     1   COLONOSCOPY N/A 06/10/2021   Procedure: COLONOSCOPY WITH BIOPSY;  Surgeon: Jinny Carmine, MD;  Location: [facility];  Service: Endoscopy;  Laterality: N/A;   CYSTOSCOPY N/A 09/30/2020   Procedure: CYSTOSCOPY;  Surgeon: Leonce Garnette BIRCH, MD;  Location: [facility];  Service: Gynecology;  Laterality: N/A;   ESOPHAGOGASTRODUODENOSCOPY N/A 06/10/2021   Procedure: ESOPHAGOGASTRODUODENOSCOPY (EGD) WITH BIOPSY;  Surgeon: Jinny Carmine, MD;  Location: [facility];  Service: Endoscopy;  Laterality: N/A;   POLYPECTOMY N/A 06/10/2021   Procedure: POLYPECTOMY;  Surgeon: Jinny Carmine, MD;  Location: [facility];  Service: Endoscopy;  Laterality: N/A;   ROBOTIC ASSISTED LAPAROSCOPIC HYSTERECTOMY AND SALPINGECTOMY Bilateral 09/30/2020   Procedure: XI ROBOTIC ASSISTED LAPAROSCOPIC HYSTERECTOMY AND SALPINGECTOMY;  Surgeon: Leonce Garnette BIRCH, MD;  Location: [facility];  Service: Gynecology;  Laterality: Bilateral;    Social History   Tobacco Use   Smoking status: Former    Current packs/day: 0.00    Types: Cigarettes    Start date: 01/23/1991    Quit date: 01/23/1996    Years since quitting: 27.6   Smokeless tobacco: Never  Vaping Use  Vaping status: Never Used  Substance Use Topics   Alcohol use: Yes    Comment: occasionally   Drug use: Never    Family History  Problem Relation Age of Onset   Hypertension Mother    Other Father        unknown medical history   Heart disease Maternal Grandmother    Stroke Maternal Grandfather    Breast cancer Neg Hx         07/26/2023    4:00 PM 03/28/2021    8:58 AM 04/12/2020    2:49 PM 03/19/2020    2:08 PM  GAD 7 : Generalized Anxiety Score  Nervous, Anxious, on Edge  0 0 2 2  Control/stop worrying 0 0 1 1  Worry too much - different things 0 0 1 1  Trouble relaxing 0 0 1 1  Restless 0 0 1 1  Easily annoyed or irritable 1 0 2 2  Afraid - awful might happen 0 0 0 0  Total GAD 7 Score 1 0 8 8  Anxiety Difficulty Not difficult at all Not difficult at all Not difficult at all Somewhat difficult       09/26/2023    4:26 PM 07/26/2023    4:00 PM 03/28/2021    8:58 AM  Depression screen PHQ 2/9  Decreased Interest 0 0 0  Down, Depressed, Hopeless 0 0 0  PHQ - 2 Score 0 0 0  Altered sleeping   0  Tired, decreased energy   0  Change in appetite   0  Feeling bad or failure about yourself    0  Trouble concentrating   0  Moving slowly or fidgety/restless   0  Suicidal thoughts   0  PHQ-9 Score   0  Difficult doing work/chores   Not difficult at all    BP Readings from Last 3 Encounters:  09/26/23 130/80  09/20/23 (!) 168/98  07/26/23 (!) 152/100    Wt Readings from Last 3 Encounters:  09/26/23 190 lb 6.4 oz (86.4 kg)  09/20/23 198 lb (89.8 kg)  07/26/23 187 lb (84.8 kg)    BP 130/80   Pulse 96   Temp 97.8 F (36.6 C)   Ht 5' 7 (1.702 m)   Wt 190 lb 6.4 oz (86.4 kg)   LMP 07/10/2020 (Exact Date)   SpO2 99%   BMI 29.82 kg/m   Physical Exam Vitals and nursing note reviewed.  Constitutional:      Appearance: Normal appearance.  Cardiovascular:     Rate and Rhythm: Normal rate and regular rhythm.     Heart sounds: No murmur heard.    No friction rub. No gallop.  Pulmonary:     Effort: Pulmonary effort is normal.     Breath sounds: Normal breath sounds.     Comments: No pleuritic pain Abdominal:     General: There is no distension.     Palpations: Abdomen is soft.     Tenderness: There is abdominal tenderness in the right upper quadrant. There is no right CVA tenderness.     Hernia: No hernia is present.     Comments: Tenderness to RUQ and underneath the lateral ribcage, although the ribs themselves and the CVA do not seem  tender. Negative bump test as well.   Musculoskeletal:        General: Normal range of motion.  Skin:    General: Skin is warm and dry.  Neurological:     Mental Status: She  is alert and oriented to person, place, and time.     Gait: Gait is intact.  Psychiatric:        Mood and Affect: Mood and affect normal.    Lab Results  Component Value Date   COLORU Light yellow 09/26/2023   CLARITYU clear 09/26/2023   GLUCOSEUR Negative 09/26/2023   BILIRUBINUR Negative 09/26/2023   KETONESU Negative 09/26/2023   SPECGRAV 1.010 09/26/2023   RBCUR Negative 09/26/2023   PHUR 6.0 09/26/2023   PROTEINUR Negative 09/26/2023   UROBILINOGEN 0.2 09/26/2023   LEUKOCYTESUR Negative 09/26/2023     Recent Labs     Component Value Date/Time   NA 132 (L) 09/20/2023 0953   NA 140 03/25/2020 1054   K 3.9 09/20/2023 0953   CL 97 (L) 09/20/2023 0953   CO2 25 09/20/2023 0953   GLUCOSE 97 09/20/2023 0953   BUN 13 09/20/2023 0953   BUN 12 03/25/2020 1054   CREATININE 0.86 09/20/2023 0953   CALCIUM 9.7 09/20/2023 0953   PROT 8.4 (H) 09/20/2023 0953   PROT 7.6 03/25/2020 1054   ALBUMIN 4.7 09/20/2023 0953   ALBUMIN 4.8 03/25/2020 1054   AST 19 09/20/2023 0953   ALT 17 09/20/2023 0953   ALKPHOS 62 09/20/2023 0953   BILITOT 0.9 09/20/2023 0953   BILITOT 0.3 03/25/2020 1054   GFRNONAA >60 09/20/2023 0953   GFRAA 105 03/25/2020 1054    Lab Results  Component Value Date   WBC 7.3 09/20/2023   HGB 15.3 (H) 09/20/2023   HCT 44.2 09/20/2023   MCV 84.5 09/20/2023   PLT 236 09/20/2023   No results found for: HGBA1C Lab Results  Component Value Date   CHOL 217 (H) 03/28/2021   HDL 53 03/28/2021   LDLCALC 147 (H) 03/28/2021   TRIG 96 03/28/2021   Lab Results  Component Value Date   TSH 2.520 04/12/2020      Assessment and Plan:  1. Flank pain (Primary) Certainly an interesting presentation. Could be stone(s) not detected on KUB that have yet to pass with tamsulosin . Continue this  and ketorolac  for now.   Since pain is mild-moderate, will try treating with Bactrim  for the next 24-48h in the event that this is a smoldering pyelonephritis or similar, since nitrofurantoin  prescribed last time seemed to help but with recurrence. If the Bactrim  does nothing, or if symptoms acutely worsen, patient to either proceed to ED or reach out via MyChart for order of stat CT abd stone protocol. She was also advised to purchased a urine filter.   Interestingly, dipstick negative today, even for leukocytes or blood. The problem may not lie within the urinary tract. Consider peptic ulcer disease if no improvement with the above plan, in which case I would try PPI and carafate with GI referral.   - POCT urinalysis dipstick - sulfamethoxazole -trimethoprim  (BACTRIM  DS) 800-160 MG tablet; Take 1 tablet by mouth 2 (two) times daily for 5 days.  Dispense: 10 tablet; Refill: 0    Today's visit billed for provider time of 32 minutes inclusive of chart review, review of external notes/labs/imaging, physical exam, medication management, and MDM.   Rolan Hoyle, PA-C, DMSc, Nutritionist [facility] [phone]

## 2023-11-02 ENCOUNTER — Encounter: Admitting: Physician Assistant
